# Patient Record
Sex: Male | Born: 1995
Health system: Southern US, Community
[De-identification: ages and names within clinical notes are randomized; demographics above are authoritative.]

## PROBLEM LIST (undated history)

## (undated) DIAGNOSIS — F419 Anxiety disorder, unspecified: Secondary | ICD-10-CM

## (undated) HISTORY — DX: Anxiety disorder, unspecified: F41.9

---
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
## 2015-05-04 ENCOUNTER — Emergency Department: Admission: EM | Admit: 2015-05-04 | Discharge: 2015-05-04 | Discharge status: Absent without leave | Payer: Self-pay

## 2016-03-07 ENCOUNTER — Emergency Department
Admission: EM | Admit: 2016-03-07 | Discharge: 2016-03-07 | Disposition: A | Discharge status: Home / Self Care | Payer: 59 | Attending: Emergency Medicine | Admitting: Emergency Medicine

## 2016-03-07 ENCOUNTER — Encounter: Payer: Self-pay | Admitting: Emergency Medicine

## 2016-03-07 DIAGNOSIS — M545 Low back pain: Secondary | ICD-10-CM | POA: Diagnosis present

## 2016-03-07 DIAGNOSIS — Y9289 Other specified places as the place of occurrence of the external cause: Secondary | ICD-10-CM | POA: Insufficient documentation

## 2016-03-07 DIAGNOSIS — Y990 Civilian activity done for income or pay: Secondary | ICD-10-CM | POA: Diagnosis not present

## 2016-03-07 DIAGNOSIS — S39012A Strain of muscle, fascia and tendon of lower back, initial encounter: Secondary | ICD-10-CM | POA: Insufficient documentation

## 2016-03-07 DIAGNOSIS — Y9389 Activity, other specified: Secondary | ICD-10-CM | POA: Insufficient documentation

## 2016-03-07 DIAGNOSIS — X500XXA Overexertion from strenuous movement or load, initial encounter: Secondary | ICD-10-CM | POA: Insufficient documentation

## 2016-03-07 MED ORDER — CYCLOBENZAPRINE HCL 5 MG PO TABS: 5.0000 mg | ORAL_TABLET | Freq: Three times a day (TID) | ORAL | Status: DC | PRN

## 2016-03-07 MED ORDER — TRAMADOL HCL 50 MG PO TABS: 50.0000 mg | ORAL_TABLET | Freq: Four times a day (QID) | ORAL | Status: DC | PRN

## 2016-03-07 NOTE — ED Notes (Signed)
Pt presents ambulatory to flex tx room with low back pain started yesterday, worse today. States he probably lifted heavy boxes the incorrect way. Denies any other symptoms.

## 2016-03-07 NOTE — ED Provider Notes (Signed)
[facility] Emergency Department Provider Note  ____________________________________________  Time seen: Approximately 7:34 AM  I have reviewed the triage vital signs and the nursing notes.   HISTORY  Chief Complaint Back Pain    HPI Ian Phillips is a 20 y.o. male patient complained of low back pain secondary to repetitive heavy lifting yesterday at work. Patient denies any radicular component to this pain. Patient denies any bladder or bowel dysfunction. Patient is taking ibuprofen last night with no relief. Patient rates his pain as 8/10. No other palliative measures taken for this complaint.   History reviewed. No pertinent past medical history.  There are no active problems to display for this patient.   History reviewed. No pertinent past surgical history.  Current Outpatient Rx  Name  Route  Sig  Dispense  Refill  . cyclobenzaprine (FLEXERIL) 5 MG tablet   Oral   Take 1 tablet (5 mg total) by mouth every 8 (eight) hours as needed for muscle spasms.   15 tablet   1   . traMADol (ULTRAM) 50 MG tablet   Oral   Take 1 tablet (50 mg total) by mouth every 6 (six) hours as needed for moderate pain.   12 tablet   0     Allergies Review of patient's allergies indicates no known allergies.  No family history on file.  Social History Social History  Substance Use Topics  . Smoking status: Never Smoker   . Smokeless tobacco: None  . Alcohol Use: No    Review of Systems Constitutional: No fever/chills Eyes: No visual changes. ENT: No sore throat. Cardiovascular: Denies chest pain. Respiratory: Denies shortness of breath. Gastrointestinal: No abdominal pain.  No nausea, no vomiting.  No diarrhea.  No constipation. Genitourinary: Negative for dysuria. Musculoskeletal: Positive for back pain. Skin: Negative for rash. Neurological: Negative for headaches, focal weakness or  numbness.    ____________________________________________   PHYSICAL EXAM:  VITAL SIGNS: ED Triage Vitals  Enc Vitals Group     BP 03/07/16 0658 142/77 mmHg     Pulse Rate 03/07/16 0658 67     Resp 03/07/16 0658 18     Temp 03/07/16 0658 97.6 F (36.4 C)     Temp Source 03/07/16 0658 Oral     SpO2 03/07/16 0658 100 %     Weight 03/07/16 0658 194 lb (87.998 kg)     Height 03/07/16 0658  (1.803 m)     Head Cir --      Peak Flow --      Pain Score 03/07/16 0656 8     Pain Loc --      Pain Edu? --      Excl. in GC? --     Constitutional: Alert and oriented. Well appearing and in no acute distress. Eyes: Conjunctivae are normal. PERRL. EOMI. Head: Atraumatic. Nose: No congestion/rhinnorhea. Mouth/Throat: Mucous membranes are moist.  Oropharynx non-erythematous. Neck: No stridor.  No cervical spine tenderness to palpation. Hematological/Lymphatic/Immunilogical: No cervical lymphadenopathy. Cardiovascular: Normal rate, regular rhythm. Grossly normal heart sounds.  Good peripheral circulation. Respiratory: Normal respiratory effort.  No retractions. Lungs CTAB. Gastrointestinal: Soft and nontender. No distention. No abdominal bruits. No CVA tenderness. Musculoskeletal: No spinal deformity. No guarding palpation of the lumbar spinal processes. Patient has right paraspinal muscle spasm with left lateral movements. Patient has negative straight leg test. Neurologic:  Normal speech and language. No gross focal neurologic deficits are appreciated. No gait instability. Skin:  Skin is warm, dry  and intact. No rash noted. Psychiatric: Mood and affect are normal. Speech and behavior are normal.  ____________________________________________   LABS (all labs ordered are listed, but only abnormal results are displayed)  Labs Reviewed - No data to  display ____________________________________________  EKG   ____________________________________________  RADIOLOGY   ____________________________________________   PROCEDURES  Procedure(s) performed: None  Critical Care performed: No  ____________________________________________   INITIAL IMPRESSION / ASSESSMENT AND PLAN / ED COURSE  Pertinent labs & imaging results that were available during my care of the patient were reviewed by me and considered in my medical decision making (see chart for details).  Right paraspinal muscles strain. Patient given discharge care instructions. Patient prescription for Flexeril and tramadol. Patient [facility]. Patient given a work note for today. ____________________________________________   FINAL CLINICAL IMPRESSION(S) / ED DIAGNOSES  Final diagnoses:  Lumbar strain, initial encounter      Joni ReiningRonald K Smith, PA-C 03/07/16 [id number]  Emily FilbertJonathan E Williams, MD 03/07/16 [phone]

## 2016-03-07 NOTE — ED Notes (Signed)
Patient ambulatory to triage with steady gait, without difficulty or distress noted; pt reports lower back pain since yesterday after lifting boxes at work; denies desire to file workers comp at this time

## 2016-03-14 ENCOUNTER — Emergency Department: Payer: 59

## 2016-03-14 ENCOUNTER — Encounter: Payer: Self-pay | Admitting: *Deleted

## 2016-03-14 ENCOUNTER — Emergency Department
Admission: EM | Admit: 2016-03-14 | Discharge: 2016-03-14 | Disposition: A | Discharge status: Home / Self Care | Payer: 59 | Attending: Emergency Medicine | Admitting: Emergency Medicine

## 2016-03-14 DIAGNOSIS — M545 Low back pain: Secondary | ICD-10-CM

## 2016-03-14 MED ORDER — METHOCARBAMOL 500 MG PO TABS: 500.0000 mg | ORAL_TABLET | Freq: Four times a day (QID) | ORAL | Status: DC | PRN

## 2016-03-14 MED ORDER — NAPROXEN 500 MG PO TABS: 500.0000 mg | ORAL_TABLET | Freq: Two times a day (BID) | ORAL | Status: DC

## 2016-03-14 NOTE — Discharge Instructions (Signed)

## 2016-03-14 NOTE — ED Notes (Signed)
States lower back pain, states he was seen last week for same complaint, states he felt worse pain at work this AM, ambulatory to triage

## 2016-03-14 NOTE — ED Provider Notes (Signed)
[facility] Emergency Department Provider Note  ____________________________________________  Time seen: Approximately 12:10 PM  I have reviewed the triage vital signs and the nursing notes.   HISTORY  Chief Complaint Back Pain    HPI Ian Phillips is a 20 y.o. male presents for evaluation continued low back pain. Patient states that he was here one week ago for the same thing. States no relief with Flexeril. Does a lot of repetitive working, lifting at work. Concern isgoing on with his lower back. Had an MRI in the past. Describes his pain presently as 7/10, nonradiating. Denies any numbness or tingling no relief with current prescriptions.   History reviewed. No pertinent past medical history.  There are no active problems to display for this patient.   History reviewed. No pertinent past surgical history.  Current Outpatient Rx  Name  Route  Sig  Dispense  Refill  . methocarbamol (ROBAXIN) 500 MG tablet   Oral   Take 1 tablet (500 mg total) by mouth every 6 (six) hours as needed for muscle spasms.   30 tablet   0   . naproxen (NAPROSYN) 500 MG tablet   Oral   Take 1 tablet (500 mg total) by mouth 2 (two) times daily with a meal.   60 tablet   0     Allergies Review of patient's allergies indicates no known allergies.  History reviewed. No pertinent family history.  Social History Social History  Substance Use Topics  . Smoking status: Never Smoker   . Smokeless tobacco: None  . Alcohol Use: No    Review of Systems Constitutional: No fever/chills Gastrointestinal: No abdominal pain.  No nausea, no vomiting.  No diarrhea.  No constipation. Genitourinary: Negative for dysuria. Musculoskeletal: Positive for low back pain. Skin: Negative for rash. Neurological: Negative for headaches, focal weakness or numbness.  10-point ROS otherwise negative.  ____________________________________________   PHYSICAL EXAM:  VITAL  SIGNS: ED Triage Vitals  Enc Vitals Group     BP 03/14/16 1145 144/82 mmHg     Pulse Rate 03/14/16 1145 76     Resp 03/14/16 1145 18     Temp 03/14/16 1145 98.3 F (36.8 C)     Temp Source 03/14/16 1145 Oral     SpO2 03/14/16 1145 99 %     Weight 03/14/16 1145 193 lb (87.544 kg)     Height 03/14/16 1145  (1.803 m)     Head Cir --      Peak Flow --      Pain Score 03/14/16 1146 7     Pain Loc --      Pain Edu? --      Excl. in GC? --     Constitutional: Alert and oriented. Well appearing and in no acute distress. Musculoskeletal: No spinal deformity. No guarding. Positive straight leg raise bilateral with some paraspinal muscle spasms on both sides. Neurologic:  Normal speech and language. No gross focal neurologic deficits are appreciated. No gait instability. Skin:  Skin is warm, dry and intact. No rash noted. Psychiatric: Mood and affect are normal. Speech and behavior are normal.  ____________________________________________   LABS (all labs ordered are listed, but only abnormal results are displayed)  Labs Reviewed - No data to display ____________________________________________  RADIOLOGY  No acute radiological findings. ____________________________________________   PROCEDURES  Procedure(s) performed: None  Critical Care performed: No  ____________________________________________   INITIAL IMPRESSION / ASSESSMENT AND PLAN / ED COURSE  Pertinent labs &  imaging results that were available during my care of the patient were reviewed by me and considered in my medical decision making (see chart for details).  Recurrent low back pain. Rx given for Robaxin and Naprosyn for pain. Patient follow-up with PCP or return to the ER with any worsening symptomology. ____________________________________________   FINAL CLINICAL IMPRESSION(S) / ED DIAGNOSES  Final diagnoses:  Low back pain without sciatica, unspecified back pain laterality     This chart  was dictated using voice recognition software/Dragon. Despite best efforts to proofread, errors can occur which can change the meaning. Any change was purely unintentional.   Evangeline Dakinharles M Brithney Bensen, PA-C 03/14/16 1347  Emily FilbertJonathan E Williams, MD 03/14/16 1435

## 2016-12-26 DIAGNOSIS — K529 Noninfective gastroenteritis and colitis, unspecified: Secondary | ICD-10-CM | POA: Diagnosis not present

## 2017-04-28 ENCOUNTER — Encounter: Payer: Self-pay | Admitting: Emergency Medicine

## 2017-04-28 ENCOUNTER — Emergency Department: Payer: BLUE CROSS/BLUE SHIELD

## 2017-04-28 ENCOUNTER — Emergency Department
Admission: EM | Admit: 2017-04-28 | Discharge: 2017-04-28 | Disposition: A | Discharge status: Home / Self Care | Payer: BLUE CROSS/BLUE SHIELD | Attending: Emergency Medicine | Admitting: Emergency Medicine

## 2017-04-28 DIAGNOSIS — Y9372 Activity, wrestling: Secondary | ICD-10-CM | POA: Insufficient documentation

## 2017-04-28 DIAGNOSIS — W500XXA Accidental hit or strike by another person, initial encounter: Secondary | ICD-10-CM | POA: Diagnosis not present

## 2017-04-28 DIAGNOSIS — S9031XA Contusion of right foot, initial encounter: Secondary | ICD-10-CM | POA: Insufficient documentation

## 2017-04-28 DIAGNOSIS — Y929 Unspecified place or not applicable: Secondary | ICD-10-CM | POA: Insufficient documentation

## 2017-04-28 DIAGNOSIS — M79671 Pain in right foot: Secondary | ICD-10-CM | POA: Diagnosis not present

## 2017-04-28 DIAGNOSIS — Y999 Unspecified external cause status: Secondary | ICD-10-CM | POA: Insufficient documentation

## 2017-04-28 DIAGNOSIS — S99921A Unspecified injury of right foot, initial encounter: Secondary | ICD-10-CM | POA: Diagnosis not present

## 2017-04-28 MED ORDER — MELOXICAM 7.5 MG PO TABS: 7.5000 mg | ORAL_TABLET | Freq: Every day | ORAL | 1 refills | Status: AC

## 2017-04-28 NOTE — ED Provider Notes (Signed)
[facility] Emergency Department Provider Note  ____________________________________________  Time seen: Approximately 8:14 PM  I have reviewed the triage vital signs and the nursing notes.   HISTORY  Chief Complaint Foot Pain    HPI Ian Phillips is a 21 y.o. male presenting to the emergency department with acute 7/10 right foot pain for one day. Patient states that he was in a wrestling practice last night and was accidentally kicked in the right foot. Patient states that he fell but he did not hit his head. Patient states that he has been able to ambulate since the incident. He denies radiculopathy, weakness, pain out of proportion and coldness of the right lower extremity. Patient denies prior traumas or surgeries to the right lower extremity. No alleviating measures have been attempted.   History reviewed. No pertinent past medical history.  There are no active problems to display for this patient.   History reviewed. No pertinent surgical history.  Prior to Admission medications   Medication Sig Start Date End Date Taking? Authorizing Provider  ibuprofen (ADVIL,MOTRIN) 600 MG tablet Take 600 mg by mouth every 6 (six) hours as needed for mild pain or moderate pain.   Yes [provider]  meloxicam (MOBIC) 7.5 MG tablet Take 1 tablet (7.5 mg total) by mouth daily. 04/28/17 05/05/17  Orvil Feil, PA-C    Allergies Patient has no known allergies.  History reviewed. No pertinent family history.  Social History Social History  Substance Use Topics  . Smoking status: Never Smoker  . Smokeless tobacco: Never Used  . Alcohol use No     Review of Systems  Constitutional: No fever/chills Eyes: No visual changes. No discharge ENT: No upper respiratory complaints. Cardiovascular: no chest pain. Respiratory: no cough. No SOB. Gastrointestinal: No abdominal pain.  No nausea, no vomiting.  No diarrhea. No  constipation. Musculoskeletal: Patient has right foot pain.  Skin: Negative for rash, abrasions, lacerations, ecchymosis. Neurological: Negative for headaches, focal weakness or numbness.   ____________________________________________   PHYSICAL EXAM:  VITAL SIGNS: ED Triage Vitals  Enc Vitals Group     BP 04/28/17 1914 (!) 166/78     Pulse Rate 04/28/17 1914 79     Resp 04/28/17 1914 14     Temp 04/28/17 1914 98.1 F (36.7 C)     Temp Source 04/28/17 1914 Oral     SpO2 04/28/17 1914 100 %     Weight 04/28/17 1914 195 lb (88.5 kg)     Height 04/28/17 1914 5\' 11"  (1.803 m)     Head Circumference --      Peak Flow --      Pain Score 04/28/17 1913 7     Pain Loc --      Pain Edu? --      Excl. in GC? --      Constitutional: Alert and oriented. Well appearing and in no acute distress. Eyes: Conjunctivae are normal. PERRL. EOMI. Head: Atraumatic. Cardiovascular: Normal rate, regular rhythm. Normal S1 and S2.  Good peripheral circulation. Respiratory: Normal respiratory effort without tachypnea or retractions. Lungs CTAB. Good air entry to the bases with no decreased or absent breath sounds. Musculoskeletal:Patient has 5/5 strength in the lower extremities bilaterally.Full range of motion at the hip, knee and ankle bilaterally. Patient is able to move all 5 right toes. Pain is elicited with light palpation along the first through fourth right metatarsals. Palpable dorsalis pedis pulse bilaterally and symmetrically. No changes in gait.   Neurologic:  Normal speech and language. No gross focal neurologic deficits are appreciated.  Skin:  Skin is warm, dry and intact. No rash noted. Psychiatric: Mood and affect are normal. Speech and behavior are normal. Patient exhibits appropriate insight and judgement.   ____________________________________________   LABS (all labs ordered are listed, but only abnormal results are displayed)  Labs Reviewed - No data to  display ____________________________________________  EKG   ____________________________________________  RADIOLOGY Geraldo PitterI, Lakrista Scaduto M Finnian Husted, personally viewed and evaluated these images (plain radiographs) as part of my medical decision making, as well as reviewing the written report by the radiologist.  Dg Foot Complete Right  Result Date: 04/28/2017 CLINICAL DATA:  Right foot pain/injury EXAM: RIGHT FOOT COMPLETE - 3+ VIEW COMPARISON:  None. FINDINGS: No fracture or dislocation is seen. The joint spaces are preserved. Visualized soft tissues are within normal limits. IMPRESSION: Negative. Electronically Signed   By: Charline BillsSriyesh  Krishnan M.D.   On: 04/28/2017 19:34    ____________________________________________    PROCEDURES  Procedure(s) performed:    Procedures    Medications - No data to display   ____________________________________________   INITIAL IMPRESSION / ASSESSMENT AND PLAN / ED COURSE  Pertinent labs & imaging results that were available during my care of the patient were reviewed by me and considered in my medical decision making (see chart for details).  Review of the [place] CSRS was performed in accordance of the [facility] prior to dispensing any controlled drugs.     Assessment and plan: Right Foot Pain:  Patient presents to the emergency department with right foot pain for one day. Patient states that he was accidentally kicked during a wrestling practice. DG right foot reveals no acute fractures or bony abnormalities. Physical exam is reassuring. Patient was discharged with Mobic to be used as needed for pain and inflammation. A referral was given to podiatry, Dr. Orland Jarredroxler. Vital signs were reassuring prior to discharge. All patient questions were answered.    ____________________________________________  FINAL CLINICAL IMPRESSION(S) / ED DIAGNOSES  Final diagnoses:  Contusion of right foot, initial encounter      NEW MEDICATIONS STARTED DURING THIS  VISIT:  Discharge Medication List as of 04/28/2017  8:00 PM    START taking these medications   Details  meloxicam (MOBIC) 7.5 MG tablet Take 1 tablet (7.5 mg total) by mouth daily., Starting Sun 04/28/2017, Until Sun 05/05/2017, Print            This chart was dictated using voice recognition software/Dragon. Despite best efforts to proofread, errors can occur which can change the meaning. Any change was purely unintentional.    Gasper LloydWoods, Jery Hollern M, PA-C 04/28/17 2033    Myrna BlazerSchaevitz, David Matthew, MD 04/28/17 2350
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
## 2017-04-28 NOTE — ED Notes (Signed)
Pt with pain to top of right foot. Cms intact to right toes. Pt complains of increased pain to top of foot with flexion of toes.

## 2017-07-15 ENCOUNTER — Emergency Department
Admission: EM | Admit: 2017-07-15 | Discharge: 2017-07-15 | Disposition: A | Discharge status: Home / Self Care | Payer: BLUE CROSS/BLUE SHIELD | Attending: Student in an Organized Health Care Education/Training Program | Admitting: Student in an Organized Health Care Education/Training Program

## 2017-07-15 ENCOUNTER — Emergency Department: Payer: BLUE CROSS/BLUE SHIELD

## 2017-07-15 DIAGNOSIS — M25562 Pain in left knee: Secondary | ICD-10-CM | POA: Diagnosis not present

## 2017-07-15 DIAGNOSIS — Y998 Other external cause status: Secondary | ICD-10-CM | POA: Diagnosis not present

## 2017-07-15 DIAGNOSIS — Y929 Unspecified place or not applicable: Secondary | ICD-10-CM | POA: Diagnosis not present

## 2017-07-15 DIAGNOSIS — Y9372 Activity, wrestling: Secondary | ICD-10-CM | POA: Diagnosis not present

## 2017-07-15 DIAGNOSIS — X501XXA Overexertion from prolonged static or awkward postures, initial encounter: Secondary | ICD-10-CM | POA: Diagnosis not present

## 2017-07-15 MED ORDER — NAPROXEN 500 MG PO TABS
500.0000 mg | ORAL_TABLET | Freq: Once | ORAL | Status: AC
  Administered 2017-07-15: 500 mg via ORAL
  Filled 2017-07-15: qty 1

## 2017-07-15 MED ORDER — TRAMADOL HCL 50 MG PO TABS
50.0000 mg | ORAL_TABLET | Freq: Once | ORAL | Status: AC
Start: 2017-07-15 — End: 2017-07-15
  Administered 2017-07-15: 50 mg via ORAL
  Filled 2017-07-15: qty 1

## 2017-07-15 MED ORDER — NAPROXEN 500 MG PO TABS: 500.0000 mg | ORAL_TABLET | Freq: Two times a day (BID) | ORAL | Status: DC

## 2017-07-15 MED ORDER — TRAMADOL HCL 50 MG PO TABS: 50.0000 mg | ORAL_TABLET | Freq: Four times a day (QID) | ORAL | 0 refills | Status: DC | PRN

## 2017-07-15 NOTE — ED Notes (Signed)
See triage note  States he was wrestling this weekend and developed pain to left knee  States he is having increased pain with bending and straightening  No swelling or deformity noted

## 2017-07-15 NOTE — Discharge Instructions (Signed)
Knee nebulizer for 3-5 days as needed. Advise clearance from orthopedics or sports medicine doctor before resuming competitive activities.

## 2017-07-15 NOTE — ED Triage Notes (Signed)
Pt states he injured his left knee on Saturday.the patient ambulatory to triage without difficulty.

## 2017-07-15 NOTE — ED Provider Notes (Signed)
[facility] Emergency Department Provider Note   ____________________________________________   First MD Initiated Contact with Patient 07/15/17 1443     (approximate)  I have reviewed the triage vital signs and the nursing notes.   HISTORY  Chief Complaint Knee Pain    HPI Ian Phillips is a 21 y.o. male patient complaining the left knee pain secondary to a twisting incident 2 days ago. Patient was in a wrestling match in which there was a hyperextension twisting incident to the left knee. Patient stated pain increases with extension and weightbearing. Patient points to the medial aspect of his knee as a source of pain. No palliative measures for complaint. Patient rates the pain as a 5/10. Patient described a pain as a "dull ache".   History reviewed. No pertinent past medical history.  There are no active problems to display for this patient.   History reviewed. No pertinent surgical history.  Prior to Admission medications   Medication Sig Start Date End Date Taking? Authorizing Provider  ibuprofen (ADVIL,MOTRIN) 600 MG tablet Take 600 mg by mouth every 6 (six) hours as needed for mild pain or moderate pain.    [provider]  naproxen (NAPROSYN) 500 MG tablet Take 1 tablet (500 mg total) by mouth 2 (two) times daily with a meal. 07/15/17   Joni ReiningSmith, Ronald K, PA-C  traMADol (ULTRAM) 50 MG tablet Take 1 tablet (50 mg total) by mouth every 6 (six) hours as needed for moderate pain. 07/15/17   Joni ReiningSmith, Ronald K, PA-C    Allergies Patient has no known allergies.  No family history on file.  Social History Social History  Substance Use Topics  . Smoking status: Never Smoker  . Smokeless tobacco: Never Used  . Alcohol use No    Review of Systems  Constitutional: No fever/chills Eyes: No visual changes. ENT: No sore throat. Cardiovascular: Denies chest pain. Respiratory: Denies shortness of breath. Gastrointestinal: No  abdominal pain.  No nausea, no vomiting.  No diarrhea.  No constipation. Genitourinary: Negative for dysuria. Musculoskeletal: Left knee pain  Skin: Negative for rash. Neurological: Negative for headaches, focal weakness or numbness.   ____________________________________________   PHYSICAL EXAM:  VITAL SIGNS: ED Triage Vitals [07/15/17 1325]  Enc Vitals Group     BP      Pulse      Resp      Temp      Temp src      SpO2      Weight      Height      Head Circumference      Peak Flow      Pain Score 5     Pain Loc      Pain Edu?      Excl. in GC?     Constitutional: Alert and oriented. Well appearing and in no acute distress. Cardiovascular: Normal rate, regular rhythm. Grossly normal heart sounds.  Good peripheral circulation. Respiratory: Normal respiratory effort.  No retractions. Lungs CTAB. Musculoskeletal:No obvious deformity or edema to the left knee. Patient decreased range of motion with extension against resistance. Patient has moderate guarding palpation social point of the MCL.  Neurologic:  Normal speech and language. No gross focal neurologic deficits are appreciated. No gait instability. Skin:  Skin is warm, dry and intact. No rash noted. Psychiatric: Mood and affect are normal. Speech and behavior are normal.  ____________________________________________   LABS (all labs ordered are listed, but only abnormal results are displayed)  Labs Reviewed - No data to display ____________________________________________  EKG   ____________________________________________  RADIOLOGY  Dg Knee Complete 4 Views Left  Result Date: 07/15/2017 CLINICAL DATA:  Pulling sensation in the left knee after a wrestling match 2 days ago. The patient reports persistent superior, medial, and inferolateral knee pain since. The patient has difficulty walking and fully flexing and extending the knee. EXAM: LEFT KNEE - COMPLETE 4+ VIEW COMPARISON:  None in PACs FINDINGS: The  bones are subjectively adequately mineralized. The joint spaces are well maintained. There is no joint effusion. There is no acute fracture or dislocation. IMPRESSION: There is no acute or significant chronic bony abnormality of the left knee. If the patient's symptoms persist, MRI would be the most useful next imaging step. Electronically Signed   By: David  SwazilandJordan M.D.   On: 07/15/2017 15:18    ___No acute findings x-ray of the left knee. _________________________________________   PROCEDURES  Procedure(s) performed: None  Procedures  Critical Care performed: No  ____________________________________________   INITIAL IMPRESSION / ASSESSMENT AND PLAN / ED COURSE  Pertinent labs & imaging results that were available during my care of the patient were reviewed by me and considered in my medical decision making (see chart for details).  Patient's percent with left knee pain secondary to a hyperextension and twisting incident. Discussed negative x-ray finding with patient. Patient placed in a knee immobilizer and given anti-inflammatory pain medication for 3 days. Patient advised to be cleared by either orthopedics or sports medicine doctor for returning back to competitive sports. Patient given a work note.      ____________________________________________   FINAL CLINICAL IMPRESSION(S) / ED DIAGNOSES  Final diagnoses:  Acute pain of left knee      NEW MEDICATIONS STARTED DURING THIS VISIT:  New Prescriptions   NAPROXEN (NAPROSYN) 500 MG TABLET    Take 1 tablet (500 mg total) by mouth 2 (two) times daily with a meal.   TRAMADOL (ULTRAM) 50 MG TABLET    Take 1 tablet (50 mg total) by mouth every 6 (six) hours as needed for moderate pain.     Note:  This document was prepared using Dragon voice recognition software and may include unintentional dictation errors.    Joni ReiningSmith, Ronald K, PA-C 07/15/17 1545    Willy Eddyobinson, Patrick, MD 07/15/17 [phone]

## 2017-08-23 DIAGNOSIS — S8392XA Sprain of unspecified site of left knee, initial encounter: Secondary | ICD-10-CM | POA: Diagnosis not present

## 2017-08-23 DIAGNOSIS — E669 Obesity, unspecified: Secondary | ICD-10-CM | POA: Diagnosis not present

## 2017-11-03 DIAGNOSIS — M25561 Pain in right knee: Secondary | ICD-10-CM | POA: Diagnosis not present

## 2017-11-03 DIAGNOSIS — S8991XA Unspecified injury of right lower leg, initial encounter: Secondary | ICD-10-CM | POA: Diagnosis not present

## 2017-11-12 DIAGNOSIS — S8391XA Sprain of unspecified site of right knee, initial encounter: Secondary | ICD-10-CM | POA: Diagnosis not present

## 2017-11-12 DIAGNOSIS — M2351 Chronic instability of knee, right knee: Secondary | ICD-10-CM | POA: Diagnosis not present

## 2017-11-12 DIAGNOSIS — M2391 Unspecified internal derangement of right knee: Secondary | ICD-10-CM | POA: Diagnosis not present

## 2017-11-13 ENCOUNTER — Other Ambulatory Visit: Payer: Self-pay | Admitting: Orthopedic Surgery

## 2017-11-13 DIAGNOSIS — S83521A Sprain of posterior cruciate ligament of right knee, initial encounter: Secondary | ICD-10-CM

## 2017-11-20 ENCOUNTER — Ambulatory Visit
Admission: RE | Admit: 2017-11-20 | Discharge: 2017-11-20 | Disposition: A | Discharge status: Home / Self Care | Payer: BLUE CROSS/BLUE SHIELD | Source: Ambulatory Visit | Attending: Orthopedic Surgery | Admitting: Orthopedic Surgery

## 2017-11-20 DIAGNOSIS — S8391XA Sprain of unspecified site of right knee, initial encounter: Secondary | ICD-10-CM | POA: Insufficient documentation

## 2017-11-20 DIAGNOSIS — S83521A Sprain of posterior cruciate ligament of right knee, initial encounter: Secondary | ICD-10-CM

## 2017-11-20 DIAGNOSIS — X58XXXA Exposure to other specified factors, initial encounter: Secondary | ICD-10-CM | POA: Diagnosis not present

## 2017-11-20 DIAGNOSIS — M25461 Effusion, right knee: Secondary | ICD-10-CM | POA: Insufficient documentation

## 2017-11-20 DIAGNOSIS — R600 Localized edema: Secondary | ICD-10-CM | POA: Diagnosis not present

## 2018-06-24 ENCOUNTER — Other Ambulatory Visit: Payer: Self-pay

## 2018-06-24 ENCOUNTER — Emergency Department: Payer: BLUE CROSS/BLUE SHIELD

## 2018-06-24 ENCOUNTER — Emergency Department
Admission: EM | Admit: 2018-06-24 | Discharge: 2018-06-24 | Disposition: A | Discharge status: Home / Self Care | Payer: BLUE CROSS/BLUE SHIELD | Attending: Emergency Medicine | Admitting: Emergency Medicine

## 2018-06-24 ENCOUNTER — Encounter: Payer: Self-pay | Admitting: Emergency Medicine

## 2018-06-24 DIAGNOSIS — M542 Cervicalgia: Secondary | ICD-10-CM | POA: Diagnosis not present

## 2018-06-24 DIAGNOSIS — M25561 Pain in right knee: Secondary | ICD-10-CM

## 2018-06-24 DIAGNOSIS — Y998 Other external cause status: Secondary | ICD-10-CM | POA: Diagnosis not present

## 2018-06-24 DIAGNOSIS — S299XXA Unspecified injury of thorax, initial encounter: Secondary | ICD-10-CM | POA: Diagnosis not present

## 2018-06-24 DIAGNOSIS — S50811A Abrasion of right forearm, initial encounter: Secondary | ICD-10-CM | POA: Diagnosis not present

## 2018-06-24 DIAGNOSIS — S5011XA Contusion of right forearm, initial encounter: Secondary | ICD-10-CM | POA: Insufficient documentation

## 2018-06-24 DIAGNOSIS — R0789 Other chest pain: Secondary | ICD-10-CM | POA: Insufficient documentation

## 2018-06-24 DIAGNOSIS — Y92410 Unspecified street and highway as the place of occurrence of the external cause: Secondary | ICD-10-CM | POA: Insufficient documentation

## 2018-06-24 DIAGNOSIS — Y9389 Activity, other specified: Secondary | ICD-10-CM | POA: Insufficient documentation

## 2018-06-24 DIAGNOSIS — R079 Chest pain, unspecified: Secondary | ICD-10-CM | POA: Diagnosis not present

## 2018-06-24 DIAGNOSIS — S59911A Unspecified injury of right forearm, initial encounter: Secondary | ICD-10-CM | POA: Diagnosis not present

## 2018-06-24 LAB — TROPONIN I

## 2018-06-24 MED ORDER — NAPROXEN 500 MG PO TABS: 500.0000 mg | ORAL_TABLET | Freq: Two times a day (BID) | ORAL | 2 refills | Status: DC

## 2018-06-24 NOTE — ED Notes (Signed)
Patient reports pain to left arm and knee. Abrasion noted to left forearm. No active bleeding. Able to bend all extremities freely.

## 2018-06-24 NOTE — ED Notes (Signed)
First Nurse Note: Reviewed patient with Dr. Darnelle CatalanMalinda.
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
## 2018-06-24 NOTE — ED Notes (Signed)
Abrasion to RT forearm cleaned and dressed at this time

## 2018-06-24 NOTE — ED Notes (Addendum)
Purple tube sent to lab as well.
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
## 2018-07-23 DIAGNOSIS — S5011XA Contusion of right forearm, initial encounter: Secondary | ICD-10-CM | POA: Diagnosis not present

## 2018-07-23 DIAGNOSIS — S8011XA Contusion of right lower leg, initial encounter: Secondary | ICD-10-CM | POA: Diagnosis not present

## 2018-07-23 DIAGNOSIS — S8001XA Contusion of right knee, initial encounter: Secondary | ICD-10-CM | POA: Diagnosis not present

## 2018-07-23 DIAGNOSIS — E669 Obesity, unspecified: Secondary | ICD-10-CM | POA: Diagnosis not present

## 2018-08-04 IMAGING — DX DG FOOT COMPLETE 3+V*R*
3 series · 3 of 3 positions shown · non-contrast
Comparison: None.

CLINICAL DATA: Right foot pain/injury

EXAM:
RIGHT FOOT COMPLETE - 3+ VIEW

[foot ap]
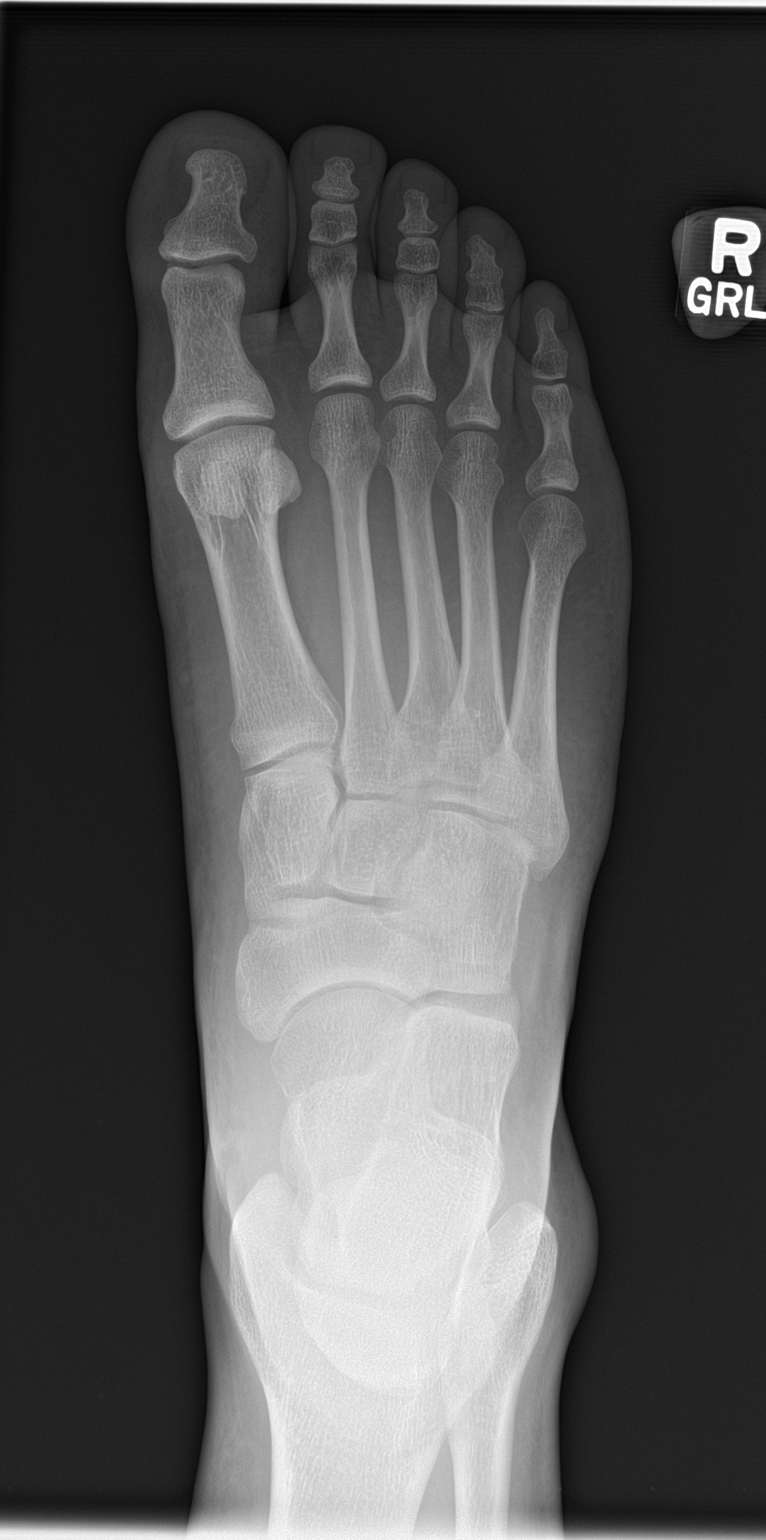

[foot obl]
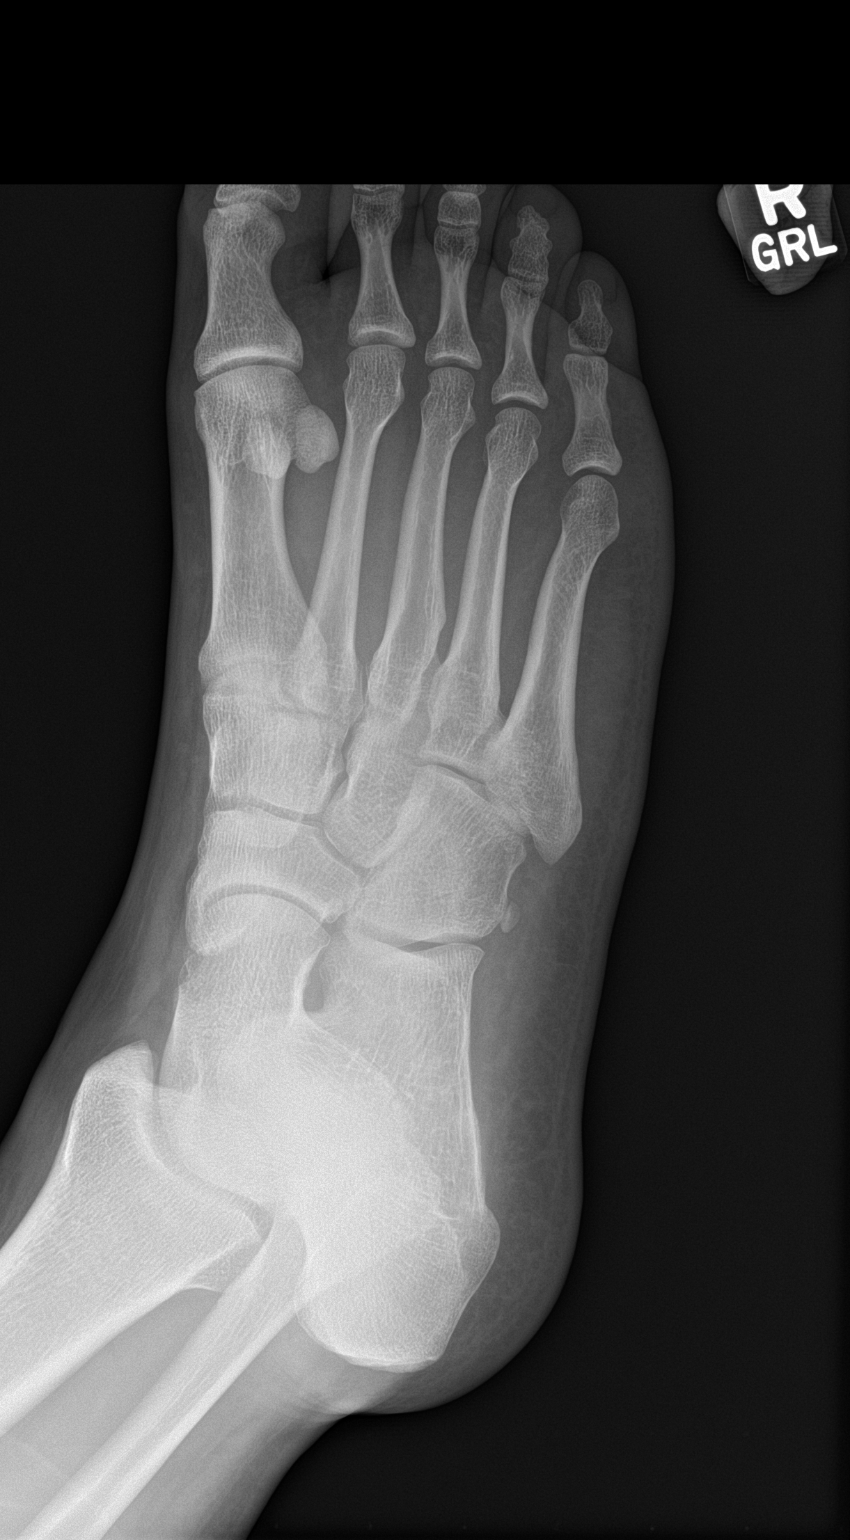

[foot lat]
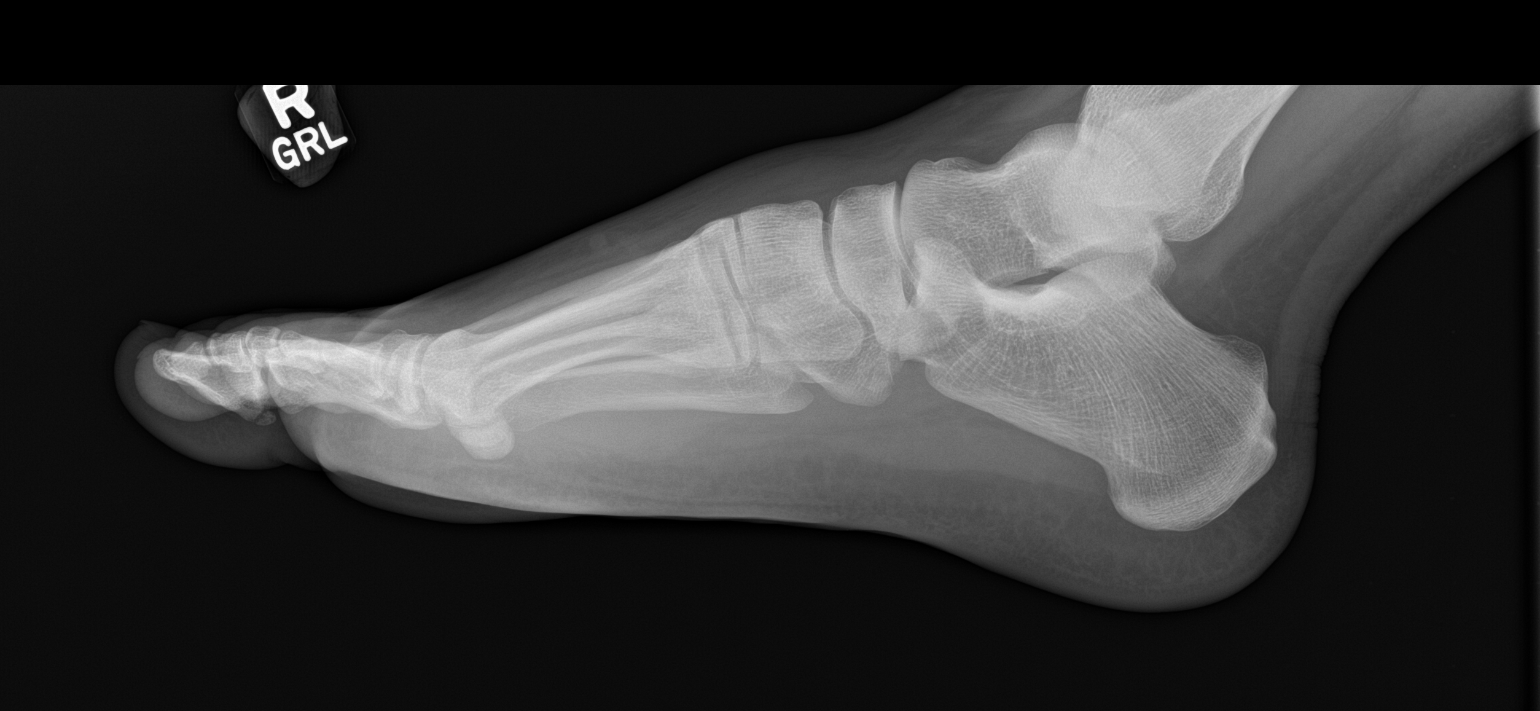

[3 of 3 positions shown; findings below may reference images not displayed]

FINDINGS: No fracture or dislocation is seen.

The joint spaces are preserved.

Visualized soft tissues are within normal limits.
IMPRESSION: Negative.

## 2018-10-01 ENCOUNTER — Other Ambulatory Visit (HOSPITAL_COMMUNITY)
Admission: RE | Admit: 2018-10-01 | Discharge: 2018-10-01 | Disposition: A | Discharge status: Home / Self Care | Payer: BLUE CROSS/BLUE SHIELD | Source: Ambulatory Visit | Attending: Physician Assistant | Admitting: Physician Assistant

## 2018-10-01 ENCOUNTER — Encounter: Payer: Self-pay | Admitting: Physician Assistant

## 2018-10-01 ENCOUNTER — Ambulatory Visit (INDEPENDENT_AMBULATORY_CARE_PROVIDER_SITE_OTHER): Payer: BLUE CROSS/BLUE SHIELD | Admitting: Physician Assistant

## 2018-10-01 VITALS — BP 128/90 | HR 77 | Temp 98.6°F | Ht 71.0 in | Wt 215.0 lb

## 2018-10-01 DIAGNOSIS — Z23 Encounter for immunization: Secondary | ICD-10-CM | POA: Diagnosis not present

## 2018-10-01 DIAGNOSIS — Z130 Encounter for screening for diseases of the blood and blood-forming organs and certain disorders involving the immune mechanism: Secondary | ICD-10-CM

## 2018-10-01 DIAGNOSIS — F419 Anxiety disorder, unspecified: Secondary | ICD-10-CM

## 2018-10-01 DIAGNOSIS — Z114 Encounter for screening for human immunodeficiency virus [HIV]: Secondary | ICD-10-CM

## 2018-10-01 DIAGNOSIS — Z131 Encounter for screening for diabetes mellitus: Secondary | ICD-10-CM

## 2018-10-01 DIAGNOSIS — Z118 Encounter for screening for other infectious and parasitic diseases: Secondary | ICD-10-CM | POA: Insufficient documentation

## 2018-10-01 DIAGNOSIS — Z1329 Encounter for screening for other suspected endocrine disorder: Secondary | ICD-10-CM

## 2018-10-01 DIAGNOSIS — Z13220 Encounter for screening for lipoid disorders: Secondary | ICD-10-CM

## 2018-10-01 MED ORDER — CITALOPRAM HYDROBROMIDE 20 MG PO TABS: 20.0000 mg | ORAL_TABLET | Freq: Every day | ORAL | 0 refills | Status: DC
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
## 2018-10-02 ENCOUNTER — Telehealth: Payer: Self-pay

## 2018-10-02 LAB — COMPREHENSIVE METABOLIC PANEL
ALT: 21 IU/L (ref 0–44)
AST: 18 IU/L (ref 0–40)
Albumin/Globulin Ratio: 2.6 — ABNORMAL HIGH (ref 1.2–2.2)
Albumin: 5 g/dL (ref 3.5–5.5)
Alkaline Phosphatase: 80 IU/L (ref 39–117)
BUN/Creatinine Ratio: 11 (ref 9–20)
BUN: 11 mg/dL (ref 6–20)
Bilirubin Total: 0.5 mg/dL (ref 0.0–1.2)
CO2: 22 mmol/L (ref 20–29)
Calcium: 10 mg/dL (ref 8.7–10.2)
Chloride: 102 mmol/L (ref 96–106)
Creatinine, Ser: 1.02 mg/dL (ref 0.76–1.27)
GFR calc Af Amer: 119 mL/min/{1.73_m2} (ref >59)
GFR calc non Af Amer: 103 mL/min/{1.73_m2} (ref >59)
Globulin, Total: 1.9 g/dL (ref 1.5–4.5)
Glucose: 87 mg/dL (ref 65–99)
Potassium: 4.4 mmol/L (ref 3.5–5.2)
Sodium: 141 mmol/L (ref 134–144)
Total Protein: 6.9 g/dL (ref 6.0–8.5)

## 2018-10-02 LAB — CBC WITH DIFFERENTIAL/PLATELET
Basophils Absolute: 0 10*3/uL (ref 0.0–0.2)
Basos: 0 %
EOS (ABSOLUTE): 0.2 10*3/uL (ref 0.0–0.4)
Eos: 3 %
Hematocrit: 40.5 % (ref 37.5–51.0)
Hemoglobin: 13.9 g/dL (ref 13.0–17.7)
Immature Grans (Abs): 0 10*3/uL (ref 0.0–0.1)
Immature Granulocytes: 0 %
Lymphocytes Absolute: 1.5 10*3/uL (ref 0.7–3.1)
Lymphs: 32 %
MCH: 28.7 pg (ref 26.6–33.0)
MCHC: 34.3 g/dL (ref 31.5–35.7)
MCV: 84 fL (ref 79–97)
Monocytes Absolute: 0.4 10*3/uL (ref 0.1–0.9)
Monocytes: 9 %
Neutrophils Absolute: 2.7 10*3/uL (ref 1.4–7.0)
Neutrophils: 56 %
Platelets: 241 10*3/uL (ref 150–450)
RBC: 4.85 x10E6/uL (ref 4.14–5.80)
RDW: 12.6 % (ref 12.3–15.4)
WBC: 4.8 10*3/uL (ref 3.4–10.8)

## 2018-10-02 LAB — LIPID PANEL
Chol/HDL Ratio: 4.2 ratio (ref 0.0–5.0)
Cholesterol, Total: 189 mg/dL (ref 100–199)
HDL: 45 mg/dL (ref >39)
LDL Calculated: 127 mg/dL — ABNORMAL HIGH (ref 0–99)
Triglycerides: 84 mg/dL (ref 0–149)
VLDL Cholesterol Cal: 17 mg/dL (ref 5–40)

## 2018-10-02 LAB — URINE CYTOLOGY ANCILLARY ONLY: Chlamydia: NEGATIVE

## 2018-10-02 LAB — TSH: TSH: 1.7 u[IU]/mL (ref 0.450–4.500)

## 2018-10-02 LAB — HIV ANTIBODY (ROUTINE TESTING W REFLEX): HIV Screen 4th Generation wRfx: NONREACTIVE

## 2018-10-02 NOTE — Telephone Encounter (Signed)
-----   Message from Trey Sailors, [place] sent at 10/02/2018  4:49 PM EDT ----- Chlamydia testing was negative.

## 2018-10-02 NOTE — Telephone Encounter (Signed)
-----   Message from Trey Sailors, [place] sent at 10/02/2018 10:39 AM EDT ----- Ian Phillips is all normal with the exception of slightly high LDL. Recommend reducing saturated fats and continuing exercise routine. Waiting on urine testing.

## 2018-10-02 NOTE — Telephone Encounter (Signed)
Na. Mail box is full. 

## 2018-10-07 NOTE — Telephone Encounter (Signed)
Na. Mail box full 

## 2018-10-09 NOTE — Telephone Encounter (Signed)
Mailed results  

## 2018-10-09 NOTE — Telephone Encounter (Signed)
Unable to reach patient voicemail box is full. KW 

## 2018-10-27 ENCOUNTER — Encounter: Payer: Self-pay | Admitting: Emergency Medicine

## 2018-10-27 ENCOUNTER — Emergency Department: Payer: BLUE CROSS/BLUE SHIELD

## 2018-10-27 ENCOUNTER — Emergency Department
Admission: EM | Admit: 2018-10-27 | Discharge: 2018-10-27 | Disposition: A | Discharge status: Home / Self Care | Payer: BLUE CROSS/BLUE SHIELD | Attending: Emergency Medicine | Admitting: Emergency Medicine

## 2018-10-27 DIAGNOSIS — Y999 Unspecified external cause status: Secondary | ICD-10-CM | POA: Insufficient documentation

## 2018-10-27 DIAGNOSIS — Y92009 Unspecified place in unspecified non-institutional (private) residence as the place of occurrence of the external cause: Secondary | ICD-10-CM | POA: Diagnosis not present

## 2018-10-27 DIAGNOSIS — S6991XA Unspecified injury of right wrist, hand and finger(s), initial encounter: Secondary | ICD-10-CM | POA: Diagnosis not present

## 2018-10-27 DIAGNOSIS — Z79899 Other long term (current) drug therapy: Secondary | ICD-10-CM | POA: Diagnosis not present

## 2018-10-27 DIAGNOSIS — Y9389 Activity, other specified: Secondary | ICD-10-CM | POA: Insufficient documentation

## 2018-10-27 DIAGNOSIS — W2209XA Striking against other stationary object, initial encounter: Secondary | ICD-10-CM | POA: Diagnosis not present

## 2018-10-27 DIAGNOSIS — S62306A Unspecified fracture of fifth metacarpal bone, right hand, initial encounter for closed fracture: Secondary | ICD-10-CM | POA: Diagnosis not present

## 2018-10-27 MED ORDER — IBUPROFEN 800 MG PO TABS
800.0000 mg | ORAL_TABLET | Freq: Once | ORAL | Status: AC
  Administered 2018-10-27: 800 mg via ORAL
  Filled 2018-10-27: qty 1

## 2018-10-27 MED ORDER — IBUPROFEN 800 MG PO TABS: 800.0000 mg | ORAL_TABLET | Freq: Three times a day (TID) | ORAL | 0 refills | Status: DC | PRN

## 2018-10-27 MED ORDER — OXYCODONE-ACETAMINOPHEN 7.5-325 MG PO TABS: 1.0000 | ORAL_TABLET | Freq: Four times a day (QID) | ORAL | 0 refills | Status: DC | PRN

## 2018-10-27 MED ORDER — OXYCODONE-ACETAMINOPHEN 5-325 MG PO TABS
1.0000 | ORAL_TABLET | Freq: Once | ORAL | Status: AC
  Administered 2018-10-27: 1 via ORAL
  Filled 2018-10-27: qty 1

## 2018-10-27 NOTE — ED Triage Notes (Signed)
Patient presents to ED via POV from home due to right hand injury. Patient reports on Thursday he punched a wall. Edema noted.

## 2018-10-27 NOTE — ED Provider Notes (Signed)
[facility] Emergency Department Provider Note   ____________________________________________   First MD Initiated Contact with Patient 10/27/18 1417     (approximate)  I have reviewed the triage vital signs and the nursing notes.   HISTORY  Chief Complaint Hand Injury    HPI Ian Phillips is a 22 y.o. male patient complain right hand pain secondary to punching a wall.  Incident occurred 4 days ago.  Patient has continued edema and pain.  Patient states Tylenol does not help with the pain.  Patient states no obvious deformity.  Patient is right-hand dominant.  Patient rates his pain as 8/10.  Patient described the pain is "achy".   Past Medical History:  Diagnosis Date  . Anxiety     There are no active problems to display for this patient.   History reviewed. No pertinent surgical history.  Prior to Admission medications   Medication Sig Start Date End Date Taking? Authorizing Provider  citalopram (CELEXA) 20 MG tablet Take 1 tablet (20 mg total) by mouth daily. 10/01/18 12/30/18  Trey Sailors, PA-C  ibuprofen (ADVIL,MOTRIN) 600 MG tablet Take 600 mg by mouth every 6 (six) hours as needed for mild pain or moderate pain.    [provider]  ibuprofen (ADVIL,MOTRIN) 800 MG tablet Take 1 tablet (800 mg total) by mouth every 8 (eight) hours as needed for moderate pain. 10/27/18   Joni Reining, PA-C  naproxen (NAPROSYN) 500 MG tablet Take 1 tablet (500 mg total) by mouth 2 (two) times daily with a meal. Patient not taking: Reported on 10/01/2018 06/24/18   Jene Every, MD  oxyCODONE-acetaminophen (PERCOCET) 7.5-325 MG tablet Take 1 tablet by mouth every 6 (six) hours as needed for severe pain. 10/27/18   Joni Reining, PA-C  traMADol (ULTRAM) 50 MG tablet Take 1 tablet (50 mg total) by mouth every 6 (six) hours as needed for moderate pain. Patient not taking: Reported on 10/01/2018 07/15/17   Joni Reining, PA-C     Allergies Patient has no known allergies.  Family History  Problem Relation Age of Onset  . Cancer Mother     Social History Social History   Tobacco Use  . Smoking status: Never Smoker  . Smokeless tobacco: Never Used  Substance Use Topics  . Alcohol use: No  . Drug use: No    Review of Systems Constitutional: No fever/chills Eyes: No visual changes. ENT: No sore throat. Cardiovascular: Denies chest pain. Respiratory: Denies shortness of breath. Gastrointestinal: No abdominal pain.  No nausea, no vomiting.  No diarrhea.  No constipation. Genitourinary: Negative for dysuria. Musculoskeletal: Right hand pain. Skin: Negative for rash. Neurological: Negative for headaches, focal weakness or numbness. Psychiatric:Anxiety. ____________________________________________   PHYSICAL EXAM:  VITAL SIGNS: ED Triage Vitals  Enc Vitals Group     BP 10/27/18 1353 (!) 155/89     Pulse Rate 10/27/18 1353 84     Resp 10/27/18 1353 16     Temp 10/27/18 1353 98 F (36.7 C)     Temp Source 10/27/18 1353 Oral     SpO2 10/27/18 1353 100 %     Weight 10/27/18 1354 218 lb (98.9 kg)     Height 10/27/18 1354 5\' 11"  (1.803 m)     Head Circumference --      Peak Flow --      Pain Score 10/27/18 1353 8     Pain Loc --      Pain Edu? --  Excl. in GC? --    Constitutional: Alert and oriented. Well appearing and in no acute distress. Cardiovascular: Normal rate, regular rhythm. Grossly normal heart sounds.  Good peripheral circulation. Respiratory: Normal respiratory effort.  No retractions. Lungs CTAB. Musculoskeletal: No obvious deformity for marked edema on aspect of right hand. Neurologic:  Normal speech and language. No gross focal neurologic deficits are appreciated. No gait instability. Skin:  Skin is warm, dry and intact. No rash noted. Psychiatric: Mood and affect are normal. Speech and behavior are normal.  ____________________________________________   LABS (all  labs ordered are listed, but only abnormal results are displayed)  Labs Reviewed - No data to display ____________________________________________  EKG   ____________________________________________  RADIOLOGY  ED MD interpretation:    Official radiology report(s): Dg Hand Complete Right  Result Date: 10/27/2018 CLINICAL DATA:  Fifth metacarpal pain and swelling after punching a wall. Initial encounter. EXAM: RIGHT HAND - COMPLETE 3+ VIEW COMPARISON:  None. FINDINGS: There is a minimally displaced fracture of the neck of the fifth metacarpal demonstrating moderate volar angulation. Overlying soft tissue swelling is noted. There is no dislocation. No radiopaque foreign body is evident. IMPRESSION: Angulated fifth metacarpal fracture. Electronically Signed   By: Sebastian Ache M.D.   On: 10/27/2018 14:22    ____________________________________________   PROCEDURES  Procedure(s) performed: None  .Splint Application Date/Time: 10/27/2018 2:32 PM Performed by: Lindajo Royal, NT Authorized by: Joni Reining, PA-C   Consent:    Consent obtained:  Verbal   Consent given by:  Patient   Risks discussed:  Numbness, pain and swelling Pre-procedure details:    Sensation:  Normal Procedure details:    Laterality:  Right   Location:  Hand   Hand:  R hand   Strapping: no     Splint type:  Ulnar gutter   Supplies:  Ortho-Glass Post-procedure details:    Pain:  Unchanged   Sensation:  Normal   Patient tolerance of procedure:  Tolerated well, no immediate complications    Critical Care performed: No  ____________________________________________   INITIAL IMPRESSION / ASSESSMENT AND PLAN / ED COURSE  As part of my medical decision making, I reviewed the following data within the electronic MEDICAL RECORD NUMBER    Right hand pain secondary to fifth metacarpal fracture.  Discussed x-ray findings with patient.  Patient placed in ulnar gutter splint.  Patient advised to follow  orthopedic for definitive evaluation and treatment.  Take medication as needed for pain.      ____________________________________________   FINAL CLINICAL IMPRESSION(S) / ED DIAGNOSES  Final diagnoses:  Closed displaced fracture of fifth metacarpal bone of right hand, unspecified portion of metacarpal, initial encounter     ED Discharge Orders         Ordered    oxyCODONE-acetaminophen (PERCOCET) 7.5-325 MG tablet  Every 6 hours PRN     10/27/18 1424    ibuprofen (ADVIL,MOTRIN) 800 MG tablet  Every 8 hours PRN     10/27/18 1424           Note:  This document was prepared using Dragon voice recognition software and may include unintentional dictation errors.    Joni Reining, PA-C 10/27/18 1434    Emily Filbert, MD 10/28/18 [phone]

## 2018-10-27 NOTE — Discharge Instructions (Addendum)
Wear splint until evaluation by orthopedics. °

## 2018-10-27 NOTE — ED Notes (Signed)
Pt presents with right hand pain and swelling after punching a wall on Thursday. Pt states he has been taking aleve for the pain, which is not really helping. Pt alert & oriented with NAD noted.

## 2018-10-27 NOTE — ED Notes (Signed)
Pt discharged home after verbalizing understanding of discharge instructions; nad noted. 

## 2018-10-29 DIAGNOSIS — S62336A Displaced fracture of neck of fifth metacarpal bone, right hand, initial encounter for closed fracture: Secondary | ICD-10-CM | POA: Diagnosis not present

## 2018-11-07 ENCOUNTER — Ambulatory Visit: Payer: BLUE CROSS/BLUE SHIELD | Admitting: Physician Assistant

## 2018-11-12 ENCOUNTER — Other Ambulatory Visit: Payer: Self-pay

## 2018-11-12 ENCOUNTER — Encounter: Payer: Self-pay | Admitting: Physician Assistant

## 2018-11-12 ENCOUNTER — Ambulatory Visit (INDEPENDENT_AMBULATORY_CARE_PROVIDER_SITE_OTHER): Payer: BLUE CROSS/BLUE SHIELD | Admitting: Physician Assistant

## 2018-11-12 VITALS — BP 128/92 | HR 80 | Temp 97.7°F | Ht 71.0 in | Wt 216.0 lb

## 2018-11-12 DIAGNOSIS — F419 Anxiety disorder, unspecified: Secondary | ICD-10-CM | POA: Diagnosis not present

## 2018-11-12 NOTE — Progress Notes (Signed)
Patient: Ian Phillips Male    DOB: 1996-03-02   22 y.o.   MRN: [id number] Visit Date: 11/12/2018  Today's Provider: Trey Sailors, PA-C   Chief Complaint  Patient presents with  . Medication follow up  . Hand Pain    right hand fracture 10/23/18   Subjective:    HPI  Pt presents here for follow up for his anxiety medication and he reports that it is working well and he seems to be sleeping better. He does not seem to notice a huge improvement in his mood but does feel overall he benefits from it. He plans on proposing to his girlfriend in April on a trip to [place] and he is excited about this.  Pt also reports that he broke his hand a few weeks ago 10/23/18 and he wants to discuss it with provider. He presented to the ER on 10/27/2018 and was diagnosed with right 5th metacarpal fracture. Has been to follow up with Hamilton Capri at [facility] orthopedics where he received a new brace and is to follow up in 2-3 weeks for repeat xray and exam. Still reports pain, reports discomfort with padding and the brace being too tight. Has ran out of Percocet currently and would like a refill.      No Known Allergies   Current Outpatient Medications:  .  citalopram (CELEXA) 20 MG tablet, Take 1 tablet (20 mg total) by mouth daily., Disp: 90 tablet, Rfl: 0 .  ibuprofen (ADVIL,MOTRIN) 800 MG tablet, Take 1 tablet (800 mg total) by mouth every 8 (eight) hours as needed for moderate pain., Disp: 15 tablet, Rfl: 0 .  oxyCODONE-acetaminophen (PERCOCET) 7.5-325 MG tablet, Take 1 tablet by mouth every 6 (six) hours as needed for severe pain., Disp: 12 tablet, Rfl: 0 .  ibuprofen (ADVIL,MOTRIN) 600 MG tablet, Take 600 mg by mouth every 6 (six) hours as needed for mild pain or moderate pain., Disp: , Rfl:  .  naproxen (NAPROSYN) 500 MG tablet, Take 1 tablet (500 mg total) by mouth 2 (two) times daily with a meal. (Patient not taking: Reported on 10/01/2018), Disp: 20 tablet, Rfl: 2 .  traMADol  (ULTRAM) 50 MG tablet, Take 1 tablet (50 mg total) by mouth every 6 (six) hours as needed for moderate pain. (Patient not taking: Reported on 10/01/2018), Disp: 12 tablet, Rfl: 0  Review of Systems  Constitutional: Negative.   HENT: Negative.   Eyes: Negative.   Respiratory: Negative.   Cardiovascular: Negative.   Gastrointestinal: Negative.   Endocrine: Negative.   Genitourinary: Negative.   Musculoskeletal: Positive for arthralgias (right hand pain from a fracture on 10/23/18). Negative for back pain, gait problem, joint swelling, myalgias, neck pain and neck stiffness.  Skin: Negative.   Allergic/Immunologic: Negative.   Neurological: Negative.   Hematological: Negative.   Psychiatric/Behavioral: Negative.     Social History   Tobacco Use  . Smoking status: Never Smoker  . Smokeless tobacco: Never Used  Substance Use Topics  . Alcohol use: No   Objective:   BP (!) 128/92 (BP Location: Left Arm, Patient Position: Sitting, Cuff Size: Normal)   Pulse 80   Temp 97.7 F (36.5 C) (Oral)   Ht 5\' 11"  (1.803 m)   Wt 216 lb (98 kg)   SpO2 97%   BMI 30.13 kg/m  Vitals:   11/12/18 1110  BP: (!) 128/92  Pulse: 80  Temp: 97.7 F (36.5 C)  TempSrc: Oral  SpO2: 97%  Weight: 216 lb (98 kg)  Height: 5\' 11"  (1.803 m)     Physical Exam  Constitutional: He is oriented to person, place, and time. He appears well-developed and well-nourished.  Cardiovascular: Normal rate and regular rhythm.  Pulmonary/Chest: Effort normal and breath sounds normal.  Musculoskeletal:  Right hand in ulnar gutter splint.   Neurological: He is alert and oriented to person, place, and time.  Skin: Skin is warm and dry.  Psychiatric: He has a normal mood and affect. His behavior is normal.        Assessment & Plan:     1. Anxiety  Improved on celexa, he wishes to continue the current dosage of 20 mg daily. Advised him to contact orthopedics about pain management and padding the brace to be more  comfortable.  Return in about 6 months (around 05/13/2019).  The entirety of the information documented in the History of Present Illness, Review of Systems and Physical Exam were personally obtained by me. Portions of this information were initially documented by Rondel BatonSulibeya Dimas, CMA and reviewed by me for thoroughness and accuracy.          Trey SailorsAdriana M Pollak, PA-C  [facility]

## 2018-11-12 NOTE — Patient Instructions (Signed)
[phone] - orthopedic number     Generalized Anxiety Disorder, Adult Generalized anxiety disorder (GAD) is a mental health disorder. People with this condition constantly worry about everyday events. Unlike normal anxiety, worry related to GAD is not triggered by a specific event. These worries also do not fade or get better with time. GAD interferes with life functions, including relationships, work, and school. GAD can vary from mild to severe. People with severe GAD can have intense waves of anxiety with physical symptoms (panic attacks). What are the causes? The exact cause of GAD is not known. What increases the risk? This condition is more likely to develop in:  Women.  People who have a family history of anxiety disorders.  People who are very shy.  People who experience very stressful life events, such as the death of a loved one.  People who have a very stressful family environment.  What are the signs or symptoms? People with GAD often worry excessively about many things in their lives, such as their health and family. They may also be overly concerned about:  Doing well at work.  Being on time.  Natural disasters.  Friendships.  Physical symptoms of GAD include:  Fatigue.  Muscle tension or having muscle twitches.  Trembling or feeling shaky.  Being easily startled.  Feeling like your heart is pounding or racing.  Feeling out of breath or like you cannot take a deep breath.  Having trouble falling asleep or staying asleep.  Sweating.  Nausea, diarrhea, or irritable bowel syndrome (IBS).  Headaches.  Trouble concentrating or remembering facts.  Restlessness.  Irritability.  How is this diagnosed? Your health care provider can diagnose GAD based on your symptoms and medical history. You will also have a physical exam. The health care provider will ask specific questions about your symptoms, including how severe they are, when they started,  and if they come and go. Your health care provider may ask you about your use of alcohol or drugs, including prescription medicines. Your health care provider may refer you to a mental health specialist for further evaluation. Your health care provider will do a thorough examination and may perform additional tests to rule out other possible causes of your symptoms. To be diagnosed with GAD, a person must have anxiety that:  Is out of his or her control.  Affects several different aspects of his or her life, such as work and relationships.  Causes distress that makes him or her unable to take part in normal activities.  Includes at least three physical symptoms of GAD, such as restlessness, fatigue, trouble concentrating, irritability, muscle tension, or sleep problems.  Before your health care provider can confirm a diagnosis of GAD, these symptoms must be present more days than they are not, and they must last for six months or longer. How is this treated? The following therapies are usually used to treat GAD:  Medicine. Antidepressant medicine is usually prescribed for long-term daily control. Antianxiety medicines may be added in severe cases, especially when panic attacks occur.  Talk therapy (psychotherapy). Certain types of talk therapy can be helpful in treating GAD by providing support, education, and guidance. Options include: ? Cognitive behavioral therapy (CBT). People learn coping skills and techniques to ease their anxiety. They learn to identify unrealistic or negative thoughts and behaviors and to replace them with positive ones. ? Acceptance and commitment therapy (ACT). This treatment teaches people how to be mindful as a way to cope with unwanted thoughts and  feelings. ? Biofeedback. This process trains you to manage your body's response (physiological response) through breathing techniques and relaxation methods. You will work with a therapist while machines are used to  monitor your physical symptoms.  Stress management techniques. These include yoga, meditation, and exercise.  A mental health specialist can help determine which treatment is best for you. Some people see improvement with one type of therapy. However, other people require a combination of therapies. Follow these instructions at home:  Take over-the-counter and prescription medicines only as told by your health care provider.  Try to maintain a normal routine.  Try to anticipate stressful situations and allow extra time to manage them.  Practice any stress management or self-calming techniques as taught by your health care provider.  Do not punish yourself for setbacks or for not making progress.  Try to recognize your accomplishments, even if they are small.  Keep all follow-up visits as told by your health care provider. This is important. Contact a health care provider if:  Your symptoms do not get better.  Your symptoms get worse.  You have signs of depression, such as: ? A persistently sad, cranky, or irritable mood. ? Loss of enjoyment in activities that used to bring you joy. ? Change in weight or eating. ? Changes in sleeping habits. ? Avoiding friends or family members. ? Loss of energy for normal tasks. ? Feelings of guilt or worthlessness. Get help right away if:  You have serious thoughts about hurting yourself or others. If you ever feel like you may hurt yourself or others, or have thoughts about taking your own life, get help right away. You can go to your nearest emergency department or call:  Your local emergency services (911 in the [place]  A suicide crisis helpline, such as the National Suicide Prevention Lifeline at 615-468-9458. This is open 24 hours a day.  Summary  Generalized anxiety disorder (GAD) is a mental health disorder that involves worry that is not triggered by a specific event.  People with GAD often worry excessively about many things  in their lives, such as their health and family.  GAD may cause physical symptoms such as restlessness, trouble concentrating, sleep problems, frequent sweating, nausea, diarrhea, headaches, and trembling or muscle twitching.  A mental health specialist can help determine which treatment is best for you. Some people see improvement with one type of therapy. However, other people require a combination of therapies. This information is not intended to replace advice given to you by your health care provider. Make sure you discuss any questions you have with your health care provider. Document Released: 03/30/2013 Document Revised: 10/23/2016 Document Reviewed: 10/23/2016 Elsevier Interactive Patient Education  [employer].

## 2019-02-26 DIAGNOSIS — K5289 Other specified noninfective gastroenteritis and colitis: Secondary | ICD-10-CM | POA: Diagnosis not present

## 2019-02-26 DIAGNOSIS — J00 Acute nasopharyngitis [common cold]: Secondary | ICD-10-CM | POA: Diagnosis not present

## 2019-02-26 DIAGNOSIS — R112 Nausea with vomiting, unspecified: Secondary | ICD-10-CM | POA: Diagnosis not present

## 2019-03-12 ENCOUNTER — Encounter: Payer: Self-pay | Admitting: Physician Assistant

## 2019-03-12 ENCOUNTER — Ambulatory Visit (INDEPENDENT_AMBULATORY_CARE_PROVIDER_SITE_OTHER): Payer: BLUE CROSS/BLUE SHIELD | Admitting: Physician Assistant

## 2019-03-12 ENCOUNTER — Other Ambulatory Visit: Payer: Self-pay

## 2019-03-12 VITALS — BP 139/85 | HR 71 | Temp 97.8°F | Resp 16 | Ht 71.0 in | Wt 229.0 lb

## 2019-03-12 DIAGNOSIS — F419 Anxiety disorder, unspecified: Secondary | ICD-10-CM | POA: Diagnosis not present

## 2019-03-12 DIAGNOSIS — K529 Noninfective gastroenteritis and colitis, unspecified: Secondary | ICD-10-CM | POA: Diagnosis not present

## 2019-03-12 NOTE — Progress Notes (Signed)
Patient: Ian Phillips Male    DOB: 01-Jan-1996   23 y.o.   MRN: [id number] Visit Date: 03/12/2019  Today's Provider: Trey Sailors, PA-C   Chief Complaint  Patient presents with  . Follow-up   Subjective:     HPI Follow up evaluation: Patient comes in today to have paper work completed for insurance. He missed two days of work due to GI illness on 02/25/2019 and 02/26/2019, and they require PCP documentation of why he missed work. Reports he experience nausea and vomiting, was seen at urgent care and got Rx for immodium and zofran. Feels much better, has returned to work since then on 03/01/2019 as he was scheduled off 02/27/2019 and 02/28/2019. No fevers, chills, nausea, vomiting, or diarrhea.  Anxiety and Depression  Patient has questions about his Celexa today. He reports he is no longer taking this, reports it helped his sleep but didn't help his mood and so he stopped taking it. He wants to know if celexa causes nightmares. He says he has been having very bad dreams. When questioned further, he says that actually he has been seeing people he has known who have since died as if they are standing next to him. He reports that other people do not see these people. Reports one person he sees has been dead for at least 3 years now and he denies acute grief associated with this. He thought these might be "day dreams." He is not sure how to answer questions regarding whether he hears voices. He reports he feels like he hears his own voice. When questioned about whether this is his internal monologue, he reports he is unsure, he feels it is saying things that are "not like him." Denies command hallucinations. Denies using any drugs or alcohol. Denies SI/HI.  No Known Allergies   Current Outpatient Medications:  .  ibuprofen (ADVIL,MOTRIN) 600 MG tablet, Take 600 mg by mouth every 6 (six) hours as needed for mild pain or moderate pain., Disp: , Rfl:  .  citalopram (CELEXA) 20 MG  tablet, Take 1 tablet (20 mg total) by mouth daily., Disp: 90 tablet, Rfl: 0  Review of Systems  Constitutional: Negative for appetite change, chills and fever.  Respiratory: Negative for chest tightness, shortness of breath and wheezing.   Cardiovascular: Negative for chest pain and palpitations.  Gastrointestinal: Negative for abdominal pain, nausea and vomiting.    Social History   Tobacco Use  . Smoking status: Never Smoker  . Smokeless tobacco: Never Used  Substance Use Topics  . Alcohol use: No      Objective:   BP 139/85 (BP Location: Right Arm, Patient Position: Sitting, Cuff Size: Large)   Pulse 71   Temp 97.8 F (36.6 C) (Oral)   Resp 16   Ht 5\' 11"  (1.803 m)   Wt 229 lb (103.9 kg)   SpO2 97% Comment: room air  BMI 31.94 kg/m  Vitals:   03/12/19 0815  BP: 139/85  Pulse: 71  Resp: 16  Temp: 97.8 F (36.6 C)  TempSrc: Oral  SpO2: 97%  Weight: 229 lb (103.9 kg)  Height: 5\' 11"  (1.803 m)     Physical Exam Constitutional:      Appearance: Normal appearance.  Cardiovascular:     Rate and Rhythm: Normal rate.  Pulmonary:     Effort: Pulmonary effort is normal.     Breath sounds: Normal breath sounds.  Abdominal:     General: Abdomen is flat.  Bowel sounds are normal.  Skin:    General: Skin is warm and dry.  Neurological:     Mental Status: He is alert and oriented to person, place, and time. Mental status is at baseline.  Psychiatric:        Attention and Perception: He perceives visual hallucinations. He does not perceive auditory hallucinations.        Mood and Affect: Mood normal.        Speech: Speech normal.        Behavior: Behavior normal.     Comments: Patient appears well groom and has an appropriate affect. Does not appear to be responding to internal stimuli.          Assessment & Plan    1. Anxiety  Patient describes visual hallucinations of deceased friends that have died many years ago, does not appear to be associated with  acute grief reaction, drugs, or alcohol. Patient does seem to have appropriate affect in office and functions well in his job, still dating his girlfriend though they have decided not to get married. Will refer to psychiatry for further evaluation.  - Ambulatory referral to Psychiatry  2. Gastroenteritis  Work note provided.   The entirety of the information documented in the History of Present Illness, Review of Systems and Physical Exam were personally obtained by me. Portions of this information were initially documented by Awilda Bill, CMA and reviewed by me for thoroughness and accuracy.   F/u Prn.       Trey Sailors, PA-C  [facility]

## 2019-03-12 NOTE — Patient Instructions (Signed)
Viral Gastroenteritis, Adult    Viral gastroenteritis is also known as the stomach flu. This condition is caused by certain germs (viruses). These germs can be passed from person to person very easily (are very contagious). This condition can cause sudden watery poop (diarrhea), fever, and throwing up (vomiting).  Having watery poop and throwing up can make you feel weak and cause you to get dehydrated. Dehydration can make you tired and thirsty, make you have a dry mouth, and make it so you pee (urinate) less often. Older adults and people with other diseases or a weak defense system (immune system) are at higher risk for dehydration. It is important to replace the fluids that you lose from having watery poop and throwing up.  Follow these instructions at home:  Follow instructions from your doctor about how to care for yourself at home.  Eating and drinking  Follow these instructions as told by your doctor:   Take an oral rehydration solution (ORS). This is a drink that is sold at pharmacies and stores.   Drink clear fluids in small amounts as you are able, such as:  ? Water.  ? Ice chips.  ? Diluted fruit juice.  ? Low-calorie sports drinks.   Eat bland, easy-to-digest foods in small amounts as you are able, such as:  ? Bananas.  ? Applesauce.  ? Rice.  ? Low-fat (lean) meats.  ? Toast.  ? Crackers.   Avoid fluids that have a lot of sugar or caffeine in them.   Avoid alcohol.   Avoid spicy or fatty foods.  General instructions     Drink enough fluid to keep your pee (urine) clear or pale yellow.   Wash your hands often. If you cannot use soap and water, use hand sanitizer.   Make sure that all people in your home wash their hands well and often.   Rest at home while you get better.   Take over-the-counter and prescription medicines only as told by your doctor.   Watch your condition for any changes.   Take a warm bath to help with any burning or pain from having watery poop.   Keep all follow-up  visits as told by your doctor. This is important.  Contact a doctor if:   You cannot keep fluids down.   Your symptoms get worse.   You have new symptoms.   You feel light-headed or dizzy.   You have muscle cramps.  Get help right away if:   You have chest pain.   You feel very weak or you pass out (faint).   You see blood in your throw-up.   Your throw-up looks like coffee grounds.   You have bloody or black poop (stools) or poop that look like tar.   You have a very bad headache, a stiff neck, or both.   You have a rash.   You have very bad pain, cramping, or bloating in your belly (abdomen).   You have trouble breathing.   You are breathing very quickly.   Your heart is beating very quickly.   Your skin feels cold and clammy.   You feel confused.   You have pain when you pee.   You have signs of dehydration, such as:  ? Dark pee, hardly any pee, or no pee.  ? Cracked lips.  ? Dry mouth.  ? Sunken eyes.  ? Sleepiness.  ? Weakness.  This information is not intended to replace advice given to you by your   health care provider. Make sure you discuss any questions you have with your health care provider.  Document Released: 05/21/2008 Document Revised: 08/27/2018 Document Reviewed: 08/09/2015  Elsevier Interactive Patient Education  2019 [employer] Inc[employer]

## 2019-09-17 DIAGNOSIS — L0591 Pilonidal cyst without abscess: Secondary | ICD-10-CM | POA: Diagnosis not present

## 2019-10-27 DIAGNOSIS — Z03818 Encounter for observation for suspected exposure to other biological agents ruled out: Secondary | ICD-10-CM | POA: Diagnosis not present

## 2019-10-27 DIAGNOSIS — Z20828 Contact with and (suspected) exposure to other viral communicable diseases: Secondary | ICD-10-CM | POA: Diagnosis not present

## 2019-11-09 ENCOUNTER — Encounter (HOSPITAL_COMMUNITY): Payer: Self-pay

## 2019-11-09 ENCOUNTER — Ambulatory Visit: Payer: Self-pay

## 2019-11-09 ENCOUNTER — Emergency Department (HOSPITAL_COMMUNITY)
Admission: EM | Admit: 2019-11-09 | Discharge: 2019-11-09 | Disposition: A | Discharge status: Home / Self Care | Payer: BC Managed Care – PPO | Attending: Emergency Medicine | Admitting: Emergency Medicine

## 2019-11-09 ENCOUNTER — Other Ambulatory Visit: Payer: Self-pay

## 2019-11-09 ENCOUNTER — Telehealth: Payer: Self-pay

## 2019-11-09 DIAGNOSIS — K602 Anal fissure, unspecified: Secondary | ICD-10-CM

## 2019-11-09 DIAGNOSIS — K648 Other hemorrhoids: Secondary | ICD-10-CM | POA: Diagnosis not present

## 2019-11-09 DIAGNOSIS — K640 First degree hemorrhoids: Secondary | ICD-10-CM

## 2019-11-09 DIAGNOSIS — K5900 Constipation, unspecified: Secondary | ICD-10-CM

## 2019-11-09 DIAGNOSIS — K625 Hemorrhage of anus and rectum: Secondary | ICD-10-CM | POA: Diagnosis not present

## 2019-11-09 DIAGNOSIS — Z20818 Contact with and (suspected) exposure to other bacterial communicable diseases: Secondary | ICD-10-CM | POA: Diagnosis not present

## 2019-11-09 LAB — COMPREHENSIVE METABOLIC PANEL
ALT: 18 U/L (ref 0–44)
AST: 18 U/L (ref 15–41)
Albumin: 4.4 g/dL (ref 3.5–5.0)
Alkaline Phosphatase: 88 U/L (ref 38–126)
Anion gap: 9 (ref 5–15)
BUN: 13 mg/dL (ref 6–20)
CO2: 25 mmol/L (ref 22–32)
Calcium: 9.3 mg/dL (ref 8.9–10.3)
Chloride: 107 mmol/L (ref 98–111)
Creatinine, Ser: 0.89 mg/dL (ref 0.61–1.24)
GFR calc Af Amer: >60 mL/min (ref >60)
GFR calc non Af Amer: >60 mL/min (ref >60)
Glucose, Bld: 93 mg/dL (ref 70–99)
Potassium: 4.2 mmol/L (ref 3.5–5.1)
Sodium: 141 mmol/L (ref 135–145)
Total Bilirubin: 0.7 mg/dL (ref 0.3–1.2)
Total Protein: 7.4 g/dL (ref 6.5–8.1)

## 2019-11-09 LAB — CBC
HCT: 43.3 % (ref 39.0–52.0)
Hemoglobin: 14 g/dL (ref 13.0–17.0)
MCH: 28.6 pg (ref 26.0–34.0)
MCHC: 32.3 g/dL (ref 30.0–36.0)
MCV: 88.5 fL (ref 80.0–100.0)
Platelets: 242 10*3/uL (ref 150–400)
RBC: 4.89 MIL/uL (ref 4.22–5.81)
RDW: 12.6 % (ref 11.5–15.5)
WBC: 6.5 10*3/uL (ref 4.0–10.5)
nRBC: 0 % (ref 0.0–0.2)

## 2019-11-09 LAB — TYPE AND SCREEN
ABO/RH(D): O NEG
Antibody Screen: NEGATIVE

## 2019-11-09 LAB — POC OCCULT BLOOD, ED: Fecal Occult Bld: POSITIVE — AB

## 2019-11-09 MED ORDER — POLYETHYLENE GLYCOL 3350 17 G PO PACK: 17.0000 g | PACK | Freq: Two times a day (BID) | ORAL | 0 refills | Status: DC

## 2019-11-09 MED ORDER — HYDROCORTISONE ACETATE 25 MG RE SUPP: 25.0000 mg | Freq: Two times a day (BID) | RECTAL | 0 refills | Status: DC

## 2019-11-09 MED ORDER — HYDROCORTISONE (PERIANAL) 2.5 % EX CREA: 1.0000 "application " | TOPICAL_CREAM | Freq: Two times a day (BID) | CUTANEOUS | 0 refills | Status: DC

## 2019-11-09 NOTE — Telephone Encounter (Signed)
Patient complains that last week he had abdominal pain, vomiting and then blood in his stool.  He described it as being dark red.  He continues today with some abdominal pain but does not know if there is any more blood because he just got up a little while ago and has not had a bowel movement yet.   We do not have any more appointments for today.  I recommended urgent care or ER but he stated he went to both of them and they were "at capacity and had a lot of people not wearing masks"  He would like to do a visit with you.  I gave him one for tomorrow but told him that it had to be ok with you to wait that long.  I also told him that I was not certain it would be an in person visit.  Please let him know before the end of the day and if he is to go to the ER we need to cancel and if not I did not put what type of visit. Thanks  [phone]

## 2019-11-09 NOTE — Telephone Encounter (Signed)
I agree, I do not think evaluation here virtually, which it would have to be due to COVID, would be adequate. He may be having GI bleed, gastric ulcer, severe intestinal infection which we are not able to work up easily here. I think he should be seen in the ER. If he absolutely won't go he can keep his appointment here though delays in care may affect his outcome negatively and there is no guarantee I can appropriately work him up as an outpatient.

## 2019-11-09 NOTE — ED Provider Notes (Addendum)
[facility]-EMERGENCY DEPT Provider Note   CSN: [id number] Arrival date & time: 11/09/19  1817     History   Chief Complaint Chief Complaint  Patient presents with  . Rectal Bleeding    HPI Ian Phillips is a 23 y.o. male with a hx of anxiety presents to the Emergency Department complaining of acute rectal pain and bleeding of dark red blood yesterday after painful, large, hard bowel movement.  Pt reports straining for greater than 30min to produce BM.  Pt reports brown stool with dark red blood.  Pt reports a hx of constipation and previously hard and painful bowel movements.  He reports he has had some similar rectal bleeding but never to the volume that he saw yesterday.  He reports he has had a small bowel movement today which was also painful with a small amount of blood.  He is not bleeding between bowel movements.  He reports some left lower quadrant abdominal fullness and cramping.  No significant pain.  Patient denies fever or chills.  He reports Friday he had some nausea and vomiting but this has resolved and he has been able to tolerate p.o. since that time.  No other Covid-like symptoms.  No treatments prior to arrival.      The history is provided by the patient and medical records. No language interpreter was used.    Past Medical History:  Diagnosis Date  . Anxiety     There are no active problems to display for this patient.   History reviewed. No pertinent surgical history.      Home Medications    Prior to Admission medications   Medication Sig Start Date End Date Taking? Authorizing Provider  ibuprofen (ADVIL,MOTRIN) 600 MG tablet Take 600 mg by mouth every 6 (six) hours as needed for mild pain or moderate pain.   Yes [provider]  citalopram (CELEXA) 20 MG tablet Take 1 tablet (20 mg total) by mouth daily. 10/01/18 12/30/18  Trey SailorsPollak, Adriana M, PA-C  hydrocortisone (ANUSOL-HC) 2.5 % rectal cream Place 1 application  rectally 2 (two) times daily. 11/09/19   Kanoe Wanner, Dahlia ClientHannah, PA-C  hydrocortisone (ANUSOL-HC) 25 MG suppository Place 1 suppository (25 mg total) rectally 2 (two) times daily. For 7 days 11/09/19   Sacred Roa, Dahlia ClientHannah, PA-C  polyethylene glycol (MIRALAX / GLYCOLAX) 17 g packet Take 17 g by mouth 2 (two) times daily. Until stool softens then reduce to 1x per day. 11/09/19   Joevon Holliman, Dahlia ClientHannah, PA-C    Family History Family History  Problem Relation Age of Onset  . Cancer Mother     Social History Social History   Tobacco Use  . Smoking status: Never Smoker  . Smokeless tobacco: Never Used  Substance Use Topics  . Alcohol use: No  . Drug use: No     Allergies   Patient has no known allergies.   Review of Systems Review of Systems  Constitutional: Negative for appetite change, diaphoresis, fatigue, fever and unexpected weight change.  HENT: Negative for mouth sores.   Eyes: Negative for visual disturbance.  Respiratory: Negative for cough, chest tightness, shortness of breath and wheezing.   Cardiovascular: Negative for chest pain.  Gastrointestinal: Positive for abdominal pain and anal bleeding. Negative for constipation, diarrhea, nausea and vomiting.  Endocrine: Negative for polydipsia, polyphagia and polyuria.  Genitourinary: Negative for dysuria, frequency, hematuria and urgency.  Musculoskeletal: Negative for back pain and neck stiffness.  Skin: Negative for rash.  Allergic/Immunologic: Negative for immunocompromised state.  Neurological: Negative for syncope, light-headedness and headaches.  Hematological: Does not bruise/bleed easily.  Psychiatric/Behavioral: Negative for sleep disturbance. The patient is not nervous/anxious.      Physical Exam Updated Vital Signs BP (!) 153/97   Pulse 95   Temp 98.1 F (36.7 C) (Oral)   Resp 18   SpO2 100%   Physical Exam Vitals signs and nursing note reviewed. Exam conducted with a chaperone present.   Constitutional:      General: He is not in acute distress.    Appearance: He is not diaphoretic.  HENT:     Head: Normocephalic.  Eyes:     General: No scleral icterus.    Conjunctiva/sclera: Conjunctivae normal.  Neck:     Musculoskeletal: Normal range of motion.  Cardiovascular:     Rate and Rhythm: Normal rate and regular rhythm.     Pulses: Normal pulses.          Radial pulses are 2+ on the right side and 2+ on the left side.  Pulmonary:     Effort: No tachypnea, accessory muscle usage, prolonged expiration, respiratory distress or retractions.     Breath sounds: No stridor.     Comments: Equal chest rise. No increased work of breathing. Abdominal:     General: There is no distension.     Palpations: Abdomen is soft.     Tenderness: There is abdominal tenderness in the left lower quadrant. There is no right CVA tenderness, left CVA tenderness, guarding or rebound.     Comments: Very mild left lower quadrant abdominal tenderness.   Genitourinary:    Prostate: Normal.     Rectum: Guaiac result positive. Tenderness, anal fissure and internal hemorrhoid present. No mass. Normal anal tone.    Musculoskeletal:     Comments: Moves all extremities equally and without difficulty.  Skin:    General: Skin is warm and dry.     Capillary Refill: Capillary refill takes less than 2 seconds.  Neurological:     Mental Status: He is alert.     GCS: GCS eye subscore is 4. GCS verbal subscore is 5. GCS motor subscore is 6.     Comments: Speech is clear and goal oriented.  Psychiatric:        Mood and Affect: Mood normal.      ED Treatments / Results  Labs (all labs ordered are listed, but only abnormal results are displayed) Labs Reviewed  POC OCCULT BLOOD, ED - Abnormal; Notable for the following components:      Result Value   Fecal Occult Bld POSITIVE (*)    All other components within normal limits  COMPREHENSIVE METABOLIC PANEL  CBC  TYPE AND SCREEN  ABO/RH      Procedures Procedures (including critical care time)  Medications Ordered in ED Medications - No data to display   Initial Impression / Assessment and Plan / ED Course  I have reviewed the triage vital signs and the nursing notes.  Pertinent labs & imaging results that were available during my care of the patient were reviewed by me and considered in my medical decision making (see chart for details).  Clinical Course as of Nov 08 2338  Molli Knock Nov 09, 2019  2338 Hypertension noted however patient is very anxious.  Suspect whitecoat syndrome.  BP(!): 153/97 [HM]    Clinical Course User Index [HM] Currie Dennin, Jarrett Soho, PA-C       Patient presents for rectal bleeding.  On exam he has 2 small rectal  fissures and stage I hemorrhoid.  As this came after a large hard bowel movement these were the expected findings.  No signs of rectal abscess or cellulitis.  No evidence of rectal fistula.  Abdomen is soft and only minimally tender.  Given history of constipation, description of abdominal discomfort and exam findings I suspect this is the cause.  He has no fever or chills.  He has no history of diverticulitis and I doubt that is the reason today.  Less likely to be colitis appendicitis, bowel perforation.  Patient will need close primary care follow-up.  He will need to return to the emergency department for new or worsening symptoms, persistent bleeding.  Discussed with patient.  He states understanding and is in agreement with the plan.  Final Clinical Impressions(s) / ED Diagnoses   Final diagnoses:  Anal fissure  Grade I hemorrhoids  Constipation, unspecified constipation type    ED Discharge Orders         Ordered    hydrocortisone (ANUSOL-HC) 25 MG suppository  2 times daily     11/09/19 2329    hydrocortisone (ANUSOL-HC) 2.5 % rectal cream  2 times daily     11/09/19 2329    polyethylene glycol (MIRALAX / GLYCOLAX) 17 g packet  2 times daily     11/09/19 2329            Kamillah Didonato, Boyd Kerbs 11/09/19 2339    Derwood Kaplan, MD 11/10/19 1759

## 2019-11-09 NOTE — Telephone Encounter (Addendum)
Patient was advised and states that he will go to the ER for evaluation. FYI

## 2019-11-09 NOTE — ED Triage Notes (Signed)
Patient arrived stating on Friday he began having lower abdominal pain. On Saturday he started vomiting. Reporting that yesterday he had a formed bowel movement with dark red blood. Denies any vomiting today. Also reporting he had fish prior to the abdominal pain and unsure if it is related.

## 2019-11-09 NOTE — Telephone Encounter (Signed)
Patient called stating that he passed blood in his stool yesterday two different times. He state the water turned dark red. He states that Saturday he vomited and was sick. Yesterday no more nausea but two diarrhea stools with dark red blood. He has lower abdominal pain that causes him not to want to sit   Any bending forward causes the pain. He state that he usually has issues with constipation. He has not passed stool or blood today. He does not take blood thinners. He states that he and his girlfriend tested negative to COVID-19  2 weeks ago after an exposure. Care advice read to patient. He verbalized understanding. Call transferred to office for scheduling.  Reason for Disposition . MODERATE rectal bleeding (small blood clots, passing blood without stool, or toilet water turns red)  Answer Assessment - Initial Assessment Questions 1. APPEARANCE of BLOOD: "What color is it?" "Is it passed separately, on the surface of the stool, or mixed in with the stool?"      mixed 2. AMOUNT: "How much blood was passed?"     Very red 3. FREQUENCY: "How many times has blood been passed with the stools?"     Yesterday 2 times 4. ONSET: "When was the blood first seen in the stools?" (Days or weeks)     yesterday 5. DIARRHEA: "Is there also some diarrhea?" If so, ask: "How many diarrhea stools were passed in past 24 hours?"      Diarrhea since Saturday 5-6 stools only 2 yesterday were bloody 6. CONSTIPATION: "Do you have constipation?" If so, "How bad is it?"    no 7. RECURRENT SYMPTOMS: "Have you had blood in your stools before?" If so, ask: "When was the last time?" and "What happened that time?"      Seen it with constipation but not to this extent 8. BLOOD THINNERS: "Do you take any blood thinners?" (e.g., Coumadin/warfarin, Pradaxa/dabigatran, aspirin)     no 9. OTHER SYMPTOMS: "Do you have any other symptoms?"  (e.g., abdominal pain, vomiting, dizziness, fever)     Lower abdominal pain mid lower  stomach 10. PREGNANCY: "Is there any chance you are pregnant?" "When was your last menstrual period?"      N/A  Protocols used: RECTAL BLEEDING-A-AH

## 2019-11-09 NOTE — Discharge Instructions (Addendum)
1. Medications: Anusol cream and suppository, usual home medications 2. Treatment: rest, drink plenty of fluids, take daily fiber supplement (Benefiber) 3. Follow Up: Please followup with your primary doctor in 2-3 days for discussion of your diagnoses and further evaluation after today's visit; if you do not have a primary care doctor use the resource guide provided to find one; Please return to the ER for persistent bleeding, recurrent or worsening abdominal pain or other concerns.

## 2019-11-10 ENCOUNTER — Ambulatory Visit: Payer: Self-pay | Admitting: Physician Assistant

## 2019-11-10 LAB — ABO/RH: ABO/RH(D): O NEG

## 2020-01-18 DIAGNOSIS — Z20828 Contact with and (suspected) exposure to other viral communicable diseases: Secondary | ICD-10-CM | POA: Diagnosis not present

## 2020-11-22 ENCOUNTER — Ambulatory Visit: Payer: BLUE CROSS/BLUE SHIELD | Admitting: Physician Assistant

## 2020-11-23 ENCOUNTER — Encounter: Payer: Self-pay | Admitting: Physician Assistant

## 2020-11-23 ENCOUNTER — Other Ambulatory Visit: Payer: Self-pay

## 2020-11-23 ENCOUNTER — Ambulatory Visit (INDEPENDENT_AMBULATORY_CARE_PROVIDER_SITE_OTHER): Payer: BC Managed Care – PPO | Admitting: Physician Assistant

## 2020-11-23 VITALS — BP 146/86 | HR 88 | Temp 98.4°F | Resp 16 | Ht 72.0 in | Wt 226.0 lb

## 2020-11-23 DIAGNOSIS — G4700 Insomnia, unspecified: Secondary | ICD-10-CM | POA: Diagnosis not present

## 2020-11-23 DIAGNOSIS — F419 Anxiety disorder, unspecified: Secondary | ICD-10-CM | POA: Diagnosis not present

## 2020-11-23 DIAGNOSIS — F32A Depression, unspecified: Secondary | ICD-10-CM

## 2020-11-23 DIAGNOSIS — Z113 Encounter for screening for infections with a predominantly sexual mode of transmission: Secondary | ICD-10-CM

## 2020-11-23 DIAGNOSIS — Z23 Encounter for immunization: Secondary | ICD-10-CM

## 2020-11-23 NOTE — Progress Notes (Signed)
Established patient visit   Patient: Ian Phillips   DOB: 1996-12-08   24 y.o. Male  MRN: [id number] Visit Date: 11/23/2020  Today's healthcare provider: Trey Sailors, PA-C   Chief Complaint  Patient presents with  . Anxiety   Subjective    HPI  Anxiety, Follow-up  He was last seen for anxiety 1 years ago. Changes made at last visit include continue current medication. However, patient reports that he no longer takes citalopram.   He reports good compliance with treatment. He reports fair tolerance of treatment. He is having side effects.  Patient reports not sleeping well and increased anxiety. Currently working and has been advised by employer to be seen by a provider. 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 He reports there is an absence issue. He has been having trouble sleeping and will sleep through alarms. He has been going 1-2 days without being to sleep as it has been continuing for one to two months. He reports trying to relax with noise machine and ocean sounds. He reports he lays in bed with eyes closed but cannot fall asleep. Patient reports he will zone out more easily right now. Patient reports his friend has told him he will talk with no filter and not stop talking. Aunt with bipolar depression and mother with suspected bipolar depression.   He has started drinking more recently - 10 drinks daily ranging from 6-7 beers and a couple of mixed drinks every day or other day.   Reports a number of stressors including work, death of grandfather, end of long term relationship.   He feels his anxiety is moderate and Worse since last visit.  Symptoms: No chest pain Yes difficulty concentrating  No dizziness Yes fatigue  No feelings of losing control Yes insomnia  Yes irritable No palpitations  No panic attacks No racing thoughts  No shortness of breath No sweating  No tremors/shakes    GAD-7 Results No flowsheet data found.  PHQ-9 Scores PHQ9  SCORE ONLY 11/12/2018 10/01/2018  PHQ-9 Total Score 0 1    Has questions about STI exposures.      Medications: Outpatient Medications Prior to Visit  Medication Sig  . ibuprofen (ADVIL,MOTRIN) 600 MG tablet Take 600 mg by mouth every 6 (six) hours as needed for mild pain or moderate pain.  . polyethylene glycol (MIRALAX / GLYCOLAX) 17 g packet Take 17 g by mouth 2 (two) times daily. Until stool softens then reduce to 1x per day.  . citalopram (CELEXA) 20 MG tablet Take 1 tablet (20 mg total) by mouth daily.  . hydrocortisone (ANUSOL-HC) 2.5 % rectal cream Place 1 application rectally 2 (two) times daily. (Patient not taking: Reported on 11/23/2020)  . hydrocortisone (ANUSOL-HC) 25 MG suppository Place 1 suppository (25 mg total) rectally 2 (two) times daily. For 7 days (Patient not taking: Reported on 11/23/2020)   No facility-administered medications prior to visit.    Review of Systems  Constitutional: Positive for activity change and fatigue.  Respiratory: Negative.   Cardiovascular: Negative.   Neurological: Negative.   Psychiatric/Behavioral: Positive for agitation and sleep disturbance. Negative for self-injury. The patient is nervous/anxious.       Objective    BP (!) 146/86   Pulse 88   Temp 98.4 F (36.9 C)   Resp 16   Ht 6' (1.829 m)   Wt 226 lb (102.5 kg)   BMI 30.65 kg/m    Physical Exam Constitutional:  Appearance: Normal appearance.  Cardiovascular:     Rate and Rhythm: Normal rate and regular rhythm.     Heart sounds: Normal heart sounds.  Pulmonary:     Effort: Pulmonary effort is normal.     Breath sounds: Normal breath sounds.  Skin:    General: Skin is warm and dry.  Neurological:     Mental Status: He is alert and oriented to person, place, and time. Mental status is at baseline.  Psychiatric:        Mood and Affect: Mood normal.        Behavior: Behavior normal.       No results found for any visits on 11/23/20.  Assessment & Plan     1. Insomnia, unspecified type  Suspect bipolar depression. Advised on psychiatry and counseling. Offered to start abilify today but patient would rather wait until seeing psychiatrist. Stressed importance of following up with mental health providers. Will fill out FMLA forms for duration of 6 months.  - Ambulatory referral to Psychology  2. Anxiety and depression  - Ambulatory referral to Psychiatry - Ambulatory referral to Psychology  3. Routine screening for STI (sexually transmitted infection)   4. Need for influenza vaccination  - Flu Vaccine QUAD 6+ mos PF IM (Fluarix Quad PF)   Return if symptoms worsen or fail to improve.      I spent 30 minutes dedicated to the care of this patient on the date of this encounter to include pre-visit review of records, face-to-face time with the patient discussing bipolar depression, and post visit ordering of testing.   ITrey Sailors, PA-C, have reviewed all documentation for this visit. The documentation on 11/23/20 for the exam, diagnosis, procedures, and orders are all accurate and complete.  The entirety of the information documented in the History of Present Illness, Review of Systems and Physical Exam were personally obtained by me. Portions of this information were initially documented by [facility] and reviewed by me for thoroughness and accuracy.   I spent 30 minutes dedicated to the care of this patient on the date of this encounter to include pre-visit review of records, face-to-face time with the patient discussing bipolar depression, and post visit ordering of testing.   Maryella Shivers  [facility] [phone] (phone) [phone] (fax)  [facility]

## 2020-11-23 NOTE — Patient Instructions (Signed)

## 2020-12-02 ENCOUNTER — Telehealth: Payer: Self-pay

## 2020-12-02 NOTE — Telephone Encounter (Signed)
Copied from CRM [id number]. Topic: General - Other >> Dec 02, 2020  2:56 PM Dalphine Handing A wrote: Patient requesting callback with update on paperwork given to [facility] for his employer. Employer stated that the paperwork hasnt been received yet. Please advise

## 2020-12-03 ENCOUNTER — Encounter: Payer: Self-pay | Admitting: Physician Assistant

## 2020-12-08 NOTE — Telephone Encounter (Signed)
Copied from CRM [id number]. Topic: General - Other >> Dec 02, 2020  2:56 PM Dalphine Handing A wrote: Patient requesting callback with update on paperwork given to [facility] [employer] for his employer. Employer stated that the paperwork hasnt been received yet. Please advise >> Dec 08, 2020  2:47 PM Tamela Oddi wrote: Patient is calling again to check the status of his FMLA paperwork.  CB# [phone]

## 2020-12-13 NOTE — Telephone Encounter (Signed)
Form completed. Psychiatry and psychology have both tried to contact him on 11/25/2020. He will need to schedule up with them for further follow up and eval.

## 2022-02-19 ENCOUNTER — Ambulatory Visit: Payer: Self-pay

## 2022-02-19 NOTE — Telephone Encounter (Signed)
?  Chief Complaint: STI exposure ?Symptoms: gential itchy, discomfort, and L testicle pain ?Frequency: 1 week ago ?Pertinent Negatives: Patient denies discharge ?Disposition: [] ED /[] Urgent Care (no appt availability in office) / [x] Appointment(In office/virtual)/ []  [facility]/ [] Home Care/ [] Refused Recommended Disposition /[] [place] [facility]/ []  Follow-up with PCP ?Additional Notes: pt was asking about STI testing since he is unsure what this is.  ? ?Reason for Disposition ? Sexual intercourse (oral, vaginal, or anal) with someone who was diagnosed with or is suspected of having a STI ? ?Answer Assessment - Initial Assessment Questions ?1. STI EXPOSURE: "Were you exposed to an STI?" If so, ask: "What kind of contact did you have?" "When did that happen?" ?    1 week ago, sexual no protection ?2. TYPE of STI: "Do you know what type of STI (their diagnosis) the other person has?" ?    No ?3. STI SYMPTOMS: "Do you have any symptoms of a STI?" If so, ask: "What are they?" ?    Genital itching, and discomfort  ?4. QUESTIONS ABOUT STIs:   "Do you have questions about a particular STI?" ?    *No Answer* ?5. PREVENTION: "Do you have questions about preventing AIDS and other STIs?" ? ?Protocols used: STI Exposure or Questions-P-AH ? ?

## 2022-02-21 ENCOUNTER — Encounter: Payer: Self-pay | Admitting: Physician Assistant

## 2022-02-21 ENCOUNTER — Ambulatory Visit (INDEPENDENT_AMBULATORY_CARE_PROVIDER_SITE_OTHER): Payer: BC Managed Care – PPO | Admitting: Physician Assistant

## 2022-02-21 ENCOUNTER — Other Ambulatory Visit (HOSPITAL_COMMUNITY)
Admission: RE | Admit: 2022-02-21 | Discharge: 2022-02-21 | Disposition: A | Discharge status: Home / Self Care | Payer: BC Managed Care – PPO | Source: Ambulatory Visit | Attending: Physician Assistant | Admitting: Physician Assistant

## 2022-02-21 ENCOUNTER — Other Ambulatory Visit: Payer: Self-pay

## 2022-02-21 VITALS — BP 144/92 | HR 75 | Ht 72.0 in | Wt 236.0 lb

## 2022-02-21 DIAGNOSIS — Z202 Contact with and (suspected) exposure to infections with a predominantly sexual mode of transmission: Secondary | ICD-10-CM

## 2022-02-21 DIAGNOSIS — F419 Anxiety disorder, unspecified: Secondary | ICD-10-CM

## 2022-02-21 DIAGNOSIS — F32A Depression, unspecified: Secondary | ICD-10-CM

## 2022-02-21 NOTE — Progress Notes (Signed)
? ?  ? ? ?Established patient visit ? ? ?Patient: Ian Phillips   DOB: 1996-03-28   26 y.o. Male  MRN: [id number] ?Visit Date: 02/21/2022 ? ?Today's healthcare provider: [name], PA-C  ? ?Subjective  ?  ?Exposure to STD ?The patient's primary symptoms include genital itching and testicular pain. The patient's pertinent negatives include no genital injury, genital lesions, pelvic pain, penile discharge, penile pain, priapism or scrotal swelling. This is a new problem. The current episode started in the past 7 days (4-7 days). The problem occurs 2 to 4 times per day. The problem has been unchanged. Associated symptoms include frequency. Pertinent negatives include no abdominal pain, anorexia, chest pain, chills, constipation, coughing, diarrhea, discolored urine, dysuria, fever, flank pain, headaches, hematuria, hesitancy, joint pain, joint swelling, nausea, painful intercourse, rash, shortness of breath, sore throat, urgency, urinary retention or vomiting. The testicular pain affects the left testicle. He is sexually active.  Pt reports partner stated she recently was positive for Chlamydia "wks ago" and was treated.  Intercourse x 2 wks ago. ? ?Medications: ?Outpatient Medications Prior to Visit  ?Medication Sig  ? hydrocortisone (ANUSOL-HC) 2.5 % rectal cream Place 1 application rectally 2 (two) times daily.  ? hydrocortisone (ANUSOL-HC) 25 MG suppository Place 1 suppository (25 mg total) rectally 2 (two) times daily. For 7 days  ? ibuprofen (ADVIL,MOTRIN) 600 MG tablet Take 600 mg by mouth every 6 (six) hours as needed for mild pain or moderate pain. (Patient not taking: Reported on 02/21/2022)  ? polyethylene glycol (MIRALAX / GLYCOLAX) 17 g packet Take 17 g by mouth 2 (two) times daily. Until stool softens then reduce to 1x per day.  ? [DISCONTINUED] citalopram (CELEXA) 20 MG tablet Take 1 tablet (20 mg total) by mouth daily.  ? ?No facility-administered medications prior to visit.  ? ? ?Review of  Systems  ?Constitutional:  Negative for chills and fever.  ?HENT:  Negative for sore throat.   ?Respiratory:  Negative for cough and shortness of breath.   ?Cardiovascular:  Negative for chest pain.  ?Gastrointestinal:  Negative for abdominal pain, anorexia, constipation, diarrhea, nausea and vomiting.  ?Genitourinary:  Positive for frequency and testicular pain. Negative for dysuria, flank pain, hesitancy, pelvic pain, penile discharge, penile pain, scrotal swelling and urgency.  ?Musculoskeletal:  Negative for joint pain.  ?Skin:  Negative for rash.  ?Neurological:  Negative for headaches.  ? ?  Objective  ?  ?BP (!) 144/92 (BP Location: Left Arm, Patient Position: Sitting, Cuff Size: Large)   Pulse 75   Ht 6' (1.829 m)   Wt 236 lb (107 kg)   SpO2 99%   BMI 32.01 kg/m?  ? ? ?Physical Exam ?Vitals and nursing note reviewed.  ?Constitutional:   ?   Appearance: Normal appearance.  ?HENT:  ?   Head: Normocephalic and atraumatic.  ?Cardiovascular:  ?   Rate and Rhythm: Normal rate and regular rhythm.  ?   Pulses: Normal pulses.  ?   Heart sounds: Normal heart sounds.  ?Pulmonary:  ?   Effort: Pulmonary effort is normal.  ?   Breath sounds: Normal breath sounds.  ?Abdominal:  ?   General: Abdomen is flat. Bowel sounds are normal. There is no distension.  ?   Palpations: Abdomen is soft. There is no mass.  ?   Tenderness: There is no abdominal tenderness. There is no right CVA tenderness, left CVA tenderness or guarding.  ?   Hernia: No hernia is present.  ?Skin: ?  General: Skin is warm.  ?   Findings: No lesion or rash.  ?Neurological:  ?   Mental Status: He is alert.  ?Psychiatric:     ?   Behavior: Behavior normal.     ?   Thought Content: Thought content normal.     ?   Judgment: Judgment normal.  ?   Comments: He seems anxious  ?  ? Assessment & Plan  ?  ?1. Possible exposure to STD ?Partner was diagnosed with chlamydia.  HPI positive for urinary frequency increased, presense of genital pruritis and  testicular pain ?- POCT Urinalysis Dipstick was negative today ?- Urine Culture ?- HIV antibody (with reflex) ?- RPR ?- Urinalysis, Routine w reflex microscopic ?- Urine cytology ancillary only  ?  ?Follow-up in a week or 2 ?The patient was advised to call back or seek an in-person evaluation if the symptoms worsen or if the condition fails to improve as anticipated. ? ?I discussed the assessment and treatment plan with the patient. The patient was provided an opportunity to ask questions and all were answered. The patient agreed with the plan and demonstrated an understanding of the instructions. ? ?The entirety of the information documented in the History of Present Illness, Review of Systems and Physical Exam were personally obtained by me. Portions of this information were initially documented by the CMA and reviewed by me for thoroughness and accuracy.   ?[name], PA-C  ?[facility] ?[phone] (phone) ?[phone] (fax) ? ?[facility]  ?

## 2022-02-22 ENCOUNTER — Encounter: Payer: Self-pay | Admitting: Physician Assistant

## 2022-02-22 LAB — URINALYSIS, ROUTINE W REFLEX MICROSCOPIC
Bilirubin, UA: NEGATIVE
Glucose, UA: NEGATIVE
Ketones, UA: NEGATIVE
Leukocytes,UA: NEGATIVE
Nitrite, UA: NEGATIVE
Protein,UA: NEGATIVE
RBC, UA: NEGATIVE
Specific Gravity, UA: 1.005 (ref 1.005–1.030)
Urobilinogen, Ur: 0.2 mg/dL (ref 0.2–1.0)
pH, UA: 7 (ref 5.0–7.5)

## 2022-02-22 LAB — RPR: RPR Ser Ql: NONREACTIVE

## 2022-02-22 LAB — HIV ANTIBODY (ROUTINE TESTING W REFLEX): HIV Screen 4th Generation wRfx: NONREACTIVE

## 2022-02-23 LAB — URINE CYTOLOGY ANCILLARY ONLY
Bacterial Vaginitis-Urine: NEGATIVE
Candida Urine: NEGATIVE
Chlamydia: NEGATIVE
Comment: NEGATIVE
Comment: NEGATIVE
Comment: NORMAL
Neisseria Gonorrhea: NEGATIVE
Trichomonas: NEGATIVE

## 2022-02-23 LAB — POCT URINALYSIS DIPSTICK
Bilirubin, UA: NEGATIVE
Blood, UA: NEGATIVE
Glucose, UA: NEGATIVE
Ketones, UA: NEGATIVE
Nitrite, UA: NEGATIVE
Protein, UA: NEGATIVE
Spec Grav, UA: 1.015 (ref 1.010–1.025)
Urobilinogen, UA: 0.2 E.U./dL
pH, UA: 6 (ref 5.0–8.0)

## 2022-02-23 LAB — URINE CULTURE: Organism ID, Bacteria: NO GROWTH

## 2022-03-07 ENCOUNTER — Other Ambulatory Visit: Payer: Self-pay

## 2022-03-07 ENCOUNTER — Encounter: Payer: Self-pay | Admitting: Physician Assistant

## 2022-03-07 ENCOUNTER — Ambulatory Visit
Admission: RE | Admit: 2022-03-07 | Discharge: 2022-03-07 | Disposition: A | Discharge status: Home / Self Care | Payer: Self-pay | Source: Ambulatory Visit | Attending: Physician Assistant | Admitting: Physician Assistant

## 2022-03-07 ENCOUNTER — Ambulatory Visit
Admission: RE | Admit: 2022-03-07 | Discharge: 2022-03-07 | Disposition: A | Discharge status: Home / Self Care | Payer: Self-pay | Attending: Physician Assistant | Admitting: Physician Assistant

## 2022-03-07 ENCOUNTER — Ambulatory Visit (INDEPENDENT_AMBULATORY_CARE_PROVIDER_SITE_OTHER): Payer: BC Managed Care – PPO | Admitting: Physician Assistant

## 2022-03-07 VITALS — BP 133/87 | HR 79 | Wt 234.0 lb

## 2022-03-07 DIAGNOSIS — S99921A Unspecified injury of right foot, initial encounter: Secondary | ICD-10-CM

## 2022-03-07 DIAGNOSIS — S99911A Unspecified injury of right ankle, initial encounter: Secondary | ICD-10-CM | POA: Diagnosis not present

## 2022-03-07 NOTE — Progress Notes (Signed)
?  ?I,Shelina Luo Robinson,acting as a Education administrator for [employer], PA-C.,have documented all relevant documentation on the behalf of Mardene Speak, PA-C,as directed by  [employer], PA-C while in the presence of [employer], PA-C. ? ? ? ?Established patient visit ? ? ?Patient: Ian Phillips   DOB: 10/01/1996   26 y.o. Male  MRN: [id number] ?Visit Date: 03/07/2022 ? ?Today's healthcare provider: Mardene Speak, PA-C  ? ?Patient present to our clinic with c/o foot injury ? ? ?Subjective  ?  ?HPI  ? ?Pt present for right ankle pain.  Pt reports that he was at wrestling match on Sunday and came down on ankle wrong.  Pt reports swelling afterwards and treated with ice and ibuprofen.  Reports no improvement since. Then had fall in shower yesterday and afterwards ankle was more aggravated.      ? ?Medications: ?No outpatient medications prior to visit.  ? ?No facility-administered medications prior to visit.  ? ? ?Review of Systems  ?Constitutional:  Positive for activity change and appetite change. Negative for fever.  ?Respiratory: Negative.    ?Cardiovascular: Negative.   ?Gastrointestinal: Negative.   ?Musculoskeletal:  Positive for arthralgias, gait problem and joint swelling.  ?Neurological:  Negative for weakness.  ?Psychiatric/Behavioral:  Positive for agitation.   ? ? ?  Objective  ?  ?BP 133/87 (BP Location: Right Arm, Patient Position: Sitting, Cuff Size: Large)   Pulse 79   Wt 234 lb (106.1 kg)   SpO2 97%   BMI 31.74 kg/m?  ? ? ?Physical Exam ?Constitutional:   ?   General: He is in acute distress.  ?   Appearance: Normal appearance.  ?HENT:  ?   Head: Normocephalic and atraumatic.  ?Cardiovascular:  ?   Rate and Rhythm: Regular rhythm. Tachycardia present.  ?   Pulses: Normal pulses.  ?Pulmonary:  ?   Effort: Pulmonary effort is normal.  ?Abdominal:  ?   General: Abdomen is flat.  ?   Palpations: Abdomen is soft.  ?Musculoskeletal:  ?   Comments: ROM restricted due to pain; right foot, right ankle,  achilles tendon: tenderness on palpation and swelling ?Lateral malleolus and medical malleolus tenderness on palpation and swelling  ?Skin: ?   Findings: No bruising (on the dorsal aspect of the right foot).  ?Neurological:  ?   General: No focal deficit present.  ?   Mental Status: He is alert and oriented to person, place, and time.  ?   Gait: Gait abnormal (antalgic).  ?Psychiatric:     ?   Behavior: Behavior normal.     ?   Thought Content: Thought content normal.     ?   Judgment: Judgment normal.  ?  ? ? ? Assessment & Plan  ?  ? ?1. Foot / ankle injury, right, initial encounter ?- DG Foot Complete Right; Future ?- DG Ankle Complete Right; Future  ?- rest , ice, NSAIDs, compression , elevation, crutches for the first two days ?   ?Work note provided. FU in a week. ? ?The patient was advised to call back or seek an in-person evaluation if the symptoms worsen or if the condition fails to improve as anticipated. ? ?I discussed the assessment and treatment plan with the patient. The patient was provided an opportunity to ask questions and all were answered. The patient agreed with the plan and demonstrated an understanding of the instructions. ? ?The entirety of the information documented in the History of Present Illness, Review of Systems and Physical  Exam were personally obtained by me. Portions of this information were initially documented by the CMA and reviewed by me for thoroughness and accuracy.   ? ?I,Fue Cervenka Robinson,acting as a Education administrator for [employer], PA-C.,have documented all relevant documentation on the behalf of Mardene Speak, PA-C,as directed by  [employer], PA-C while in the presence of [employer], PA-C. ? ? ?Mardene Speak, PA-C  ?[facility] ?[phone] (phone) ?[phone] (fax) ? ?[facility] ?

## 2022-03-11 NOTE — Progress Notes (Signed)
? ?Established Patient Office Visit ? ?Subjective:  ?Patient ID: Ian Phillips, male    DOB: 09/18/1996  Age: 26 y.o. MRN: [id number] ? ? ?Chief Complaint  ?Patient presents with  ? Follow-up  ? ? ?HPI ?Finn C Bacha presents for foot/ankle injury follow-up ?  ?Patient reports that swelling subsided but pain is still here. He has been taking up to 800 mg of ibuprofen every 4 hours with some relief. He is back to work. Yesterday he tried a tall post op boot of his colleague. ?He felt that it helped with relieving of his pain. ?Denies having any fever, paresthesias. ? ?Past Medical History:  ?Diagnosis Date  ? Anxiety   ? ? ?No past surgical history on file. ? ?Family History  ?Problem Relation Age of Onset  ? Cancer Mother   ? ? ?Social History  ? ?Socioeconomic History  ? Marital status: Single  ?  Spouse name: Not on file  ? Number of children: Not on file  ? Years of education: Not on file  ? Highest education level: Not on file  ?Occupational History  ? Not on file  ?Tobacco Use  ? Smoking status: Never  ? Smokeless tobacco: Never  ?Vaping Use  ? Vaping Use: Never used  ?Substance and Sexual Activity  ? Alcohol use: No  ? Drug use: No  ? Sexual activity: Not on file  ?Other Topics Concern  ? Not on file  ?Social History Narrative  ? Not on file  ? ?Social Determinants of Health  ? ?Financial Resource Strain: Not on file  ?Food Insecurity: Not on file  ?Transportation Needs: Not on file  ?Physical Activity: Not on file  ?Stress: Not on file  ?Social Connections: Not on file  ?Intimate Partner Violence: Not on file  ? ? ?Outpatient Medications Prior to Visit  ?Medication Sig Dispense Refill  ? acetaminophen (TYLENOL) 500 MG tablet Take 500 mg by mouth every 6 (six) hours as needed.    ? ibuprofen (ADVIL) 200 MG tablet Take 200 mg by mouth every 6 (six) hours as needed.    ? ?No facility-administered medications prior to visit.  ? ? ?No Known Allergies ? ?ROS ?Review of Systems  ?Constitutional:   Positive for activity change and appetite change. Negative for chills and fever.  ?Musculoskeletal:  Positive for arthralgias and gait problem.  ? ?  ?Objective:  ?  ?Physical Exam ?Vitals and nursing note reviewed.  ?Constitutional:   ?   Appearance: Normal appearance.  ?HENT:  ?   Head: Normocephalic and atraumatic.  ?Cardiovascular:  ?   Pulses: Normal pulses.  ?Pulmonary:  ?   Effort: Pulmonary effort is normal.  ?Musculoskeletal:     ?   General: Tenderness (over the dorsal aspect of the right midfoot and achilles tendon) present.  ?Neurological:  ?   General: No focal deficit present.  ?   Mental Status: He is alert and oriented to person, place, and time.  ?Psychiatric:     ?   Behavior: Behavior normal.     ?   Thought Content: Thought content normal.     ?   Judgment: Judgment normal.  ? ? ?BP 139/90 (BP Location: Right Arm, Patient Position: Sitting, Cuff Size: Large)   Pulse 91   Temp 98.3 ?F (36.8 ?C) (Oral)   Resp 16   Ht 6' (1.829 m)   Wt 239 lb (108.4 kg)   SpO2 96%   BMI 32.41 kg/m?  ?Wt Readings from  Last 3 Encounters:  ?03/15/22 239 lb (108.4 kg)  ?03/07/22 234 lb (106.1 kg)  ?02/21/22 236 lb (107 kg)  ? ? ? ?Health Maintenance Due  ?Topic Date Due  ? COVID-19 Vaccine (1) Never done  ? HPV VACCINES (1 - Male 2-dose series) Never done  ? Hepatitis C Screening  Never done  ? INFLUENZA VACCINE  07/17/2021  ? ? ?   ?Topic Date Due  ? HPV VACCINES (1 - Male 2-dose series) Never done  ? ? ?Lab Results  ?Component Value Date  ? TSH 1.700 10/01/2018  ? ?Lab Results  ?Component Value Date  ? WBC 6.5 11/09/2019  ? HGB 14.0 11/09/2019  ? HCT 43.3 11/09/2019  ? MCV 88.5 11/09/2019  ? PLT 242 11/09/2019  ? ?Lab Results  ?Component Value Date  ? NA 141 11/09/2019  ? K 4.2 11/09/2019  ? CO2 25 11/09/2019  ? GLUCOSE 93 11/09/2019  ? BUN 13 11/09/2019  ? CREATININE 0.89 11/09/2019  ? BILITOT 0.7 11/09/2019  ? ALKPHOS 88 11/09/2019  ? AST 18 11/09/2019  ? ALT 18 11/09/2019  ? PROT 7.4 11/09/2019  ? ALBUMIN  4.4 11/09/2019  ? CALCIUM 9.3 11/09/2019  ? ANIONGAP 9 11/09/2019  ? ?Lab Results  ?Component Value Date  ? CHOL 189 10/01/2018  ? ?Lab Results  ?Component Value Date  ? HDL 45 10/01/2018  ? ?Lab Results  ?Component Value Date  ? LDLCALC 127 (H) 10/01/2018  ? ?Lab Results  ?Component Value Date  ? TRIG 84 10/01/2018  ? ?Lab Results  ?Component Value Date  ? CHOLHDL 4.2 10/01/2018  ? ?No results found for: HGBA1C ? ?  ?Assessment & Plan:  ? ?Problem List Items Addressed This Visit   ?None ?Visit Diagnoses   ? ? Foot injury, right, initial encounter    -  Primary  ? Relevant Medications  ? meloxicam (MOBIC) 15 MG tablet  ? Ankle injury, right, initial encounter      ? ?  ? ?1. Foot injury, right, initial encounter ?- meloxicam (MOBIC) 15 MG tablet; Take 1 tablet (15 mg total) by mouth daily.  Dispense: 30 tablet; Refill: 0 if patient prefers ?- Xray WNL ? ?2. Ankle injury, right, initial encounter ?-Xray WNL ?Rx for tall post op boot provided. ?- Referred to [facility] for further evaluation ?- Advised rest, ice, NSAIDs injection if needed. ? ?Meds ordered this encounter  ?Medications  ? meloxicam (MOBIC) 15 MG tablet  ?  Sig: Take 1 tablet (15 mg total) by mouth daily.  ?  Dispense:  30 tablet  ?  Refill:  0  ?  Order Specific Question:   Supervising Provider  ?  Answer:   Malva Limes [[id number]]  ?  ? ?The patient was advised to call back or seek an in-person evaluation if the symptoms worsen or if the condition fails to improve as anticipated. ? ?I discussed the assessment and treatment plan with the patient. The patient was provided an opportunity to ask questions and all were answered. The patient agreed with the plan and demonstrated an understanding of the instructions. ? ?The entirety of the information documented in the History of Present Illness, Review of Systems and Physical Exam were personally obtained by me. Portions of this information were initially documented by the CMA and reviewed by me for  thoroughness and accuracy.   ? ?[name], PA-C ?

## 2022-03-12 ENCOUNTER — Telehealth: Payer: Self-pay

## 2022-03-12 NOTE — Progress Notes (Signed)
Hello Temple C Riess ,  ? ?Your imaging results all are within normal limits. Please, continue with the management we discussed during your visit. ?No changes need to be made to medications, and no further tests need to be ordered. ? ?Any questions please reach out to the office or message me on MyChart! ? ?Best, ? ?[name], PA-C

## 2022-03-12 NOTE — Telephone Encounter (Signed)
Copied from CRM [id number]. Topic: Appointment Scheduling - Scheduling Inquiry for Clinic ?>> Mar 12, 2022  4:58 PM Pawlus, Ian Phillips wrote: ?Reason for CRM: Pt stated his appointment was supposed to be on 3/29, was advised that according to the schedule his appt is on 3/30, pt wanted Phillips call back, please advise. ?

## 2022-03-12 NOTE — Telephone Encounter (Signed)
Copied from CRM [id number]. Topic: General - Other ?>> Mar 12, 2022  5:00 PM Pawlus, Ian Phillips wrote: ?Pt stated he still has not been contacting regarding this, please advise.  ?

## 2022-03-13 NOTE — Telephone Encounter (Signed)
Attempted to reach pt. VM full.  ?

## 2022-03-15 ENCOUNTER — Encounter: Payer: Self-pay | Admitting: Physician Assistant

## 2022-03-15 ENCOUNTER — Ambulatory Visit (INDEPENDENT_AMBULATORY_CARE_PROVIDER_SITE_OTHER): Payer: BC Managed Care – PPO | Admitting: Physician Assistant

## 2022-03-15 VITALS — BP 139/90 | HR 91 | Temp 98.3°F | Resp 16 | Ht 72.0 in | Wt 239.0 lb

## 2022-03-15 DIAGNOSIS — S99921A Unspecified injury of right foot, initial encounter: Secondary | ICD-10-CM

## 2022-03-15 DIAGNOSIS — S99911A Unspecified injury of right ankle, initial encounter: Secondary | ICD-10-CM

## 2022-03-15 MED ORDER — MELOXICAM 15 MG PO TABS: 15.0000 mg | ORAL_TABLET | Freq: Every day | ORAL | 0 refills | Status: AC

## 2022-03-16 ENCOUNTER — Ambulatory Visit: Payer: BC Managed Care – PPO | Admitting: Physician Assistant

## 2022-03-18 DIAGNOSIS — S99921A Unspecified injury of right foot, initial encounter: Secondary | ICD-10-CM | POA: Insufficient documentation

## 2022-03-18 DIAGNOSIS — S99911A Unspecified injury of right ankle, initial encounter: Secondary | ICD-10-CM | POA: Insufficient documentation

## 2022-05-10 ENCOUNTER — Ambulatory Visit: Payer: BC Managed Care – PPO | Admitting: Physician Assistant

## 2022-05-10 NOTE — Progress Notes (Unsigned)
     I,Ian Phillips,acting as a Neurosurgeon for [employer], PA-C.,have documented all relevant documentation on the behalf of Ian Lat, PA-C,as directed by  [employer], PA-C while in the presence of [employer], PA-C.   Established patient visit   Patient: Ian Phillips   DOB: Nov 14, 1996   26 y.o. Male  MRN: [id number] Visit Date: 05/10/2022  Today's healthcare provider: [name], PA-C   No chief complaint on file. CC:  Subjective    Exposure to STD     Medications: Outpatient Medications Prior to Visit  Medication Sig   acetaminophen (TYLENOL) 500 MG tablet Take 500 mg by mouth every 6 (six) hours as needed.   ibuprofen (ADVIL) 200 MG tablet Take 200 mg by mouth every 6 (six) hours as needed.   meloxicam (MOBIC) 15 MG tablet Take 1 tablet (15 mg total) by mouth daily.   No facility-administered medications prior to visit.    Review of Systems  {Labs  Heme  Chem  Endocrine  Serology  Results Review (optional):23779}   Objective    There were no vitals taken for this visit. {Show previous vital signs (optional):23777}  Physical Exam  ***  No results found for any visits on 05/10/22.  Assessment & Plan     ***  No follow-ups on file.      {provider attestation***:1}   Ian Phillips, Cordelia Poche  [facility] [phone] (phone) [phone] (fax)  [facility]

## 2024-06-26 ENCOUNTER — Other Ambulatory Visit: Payer: Self-pay

## 2024-06-26 ENCOUNTER — Emergency Department: Payer: Self-pay

## 2024-06-26 ENCOUNTER — Emergency Department
Admission: EM | Admit: 2024-06-26 | Discharge: 2024-06-27 | Disposition: A | Discharge status: Home / Self Care | Payer: Self-pay

## 2024-06-26 DIAGNOSIS — M542 Cervicalgia: Secondary | ICD-10-CM | POA: Insufficient documentation

## 2024-06-26 MED ORDER — LORAZEPAM 1 MG PO TABS
1.0000 mg | ORAL_TABLET | Freq: Once | ORAL | Status: AC
  Administered 2024-06-26: 1 mg via ORAL
  Filled 2024-06-26: qty 1

## 2024-06-26 MED ORDER — IBUPROFEN 800 MG PO TABS
800.0000 mg | ORAL_TABLET | Freq: Once | ORAL | Status: AC
  Administered 2024-06-26: 800 mg via ORAL
  Filled 2024-06-26: qty 1

## 2024-06-26 NOTE — ED Provider Notes (Incomplete)
 [facility] Provider Note    Event Date/Time   First MD Initiated Contact with Patient 06/26/24 2328     (approximate)   History   Neck Pain   HPI  Ian Phillips is a 28 y.o. male with PMH of anxiety who presents for evaluation of neck pain that began 3 6 ago.  Patient participates in wrestle mania and while practicing for a stunt, he will Suplexa onto his head.  He landed on the head and felt a crunch in his neck.  He has had ongoing pain since this incident.  He reports some numbness and tingling in the hands that wakes him up at night.  He has also had decreased strength on the left side.       Physical Exam   Triage Vital Signs: ED Triage Vitals  Encounter Vitals Group     BP 06/26/24 1815 (!) 163/119     Girls Systolic BP Percentile --      Girls Diastolic BP Percentile --      Boys Systolic BP Percentile --      Boys Diastolic BP Percentile --      Pulse Rate 06/26/24 1815 70     Resp 06/26/24 1815 18     Temp 06/26/24 1815 98 F (36.7 C)     Temp src --      SpO2 06/26/24 1815 100 %     Weight 06/26/24 1813 240 lb (108.9 kg)     Height 06/26/24 1813 5' 10 (1.778 m)     Head Circumference --      Peak Flow --      Pain Score 06/26/24 1813 7     Pain Loc --      Pain Education --      Exclude from Growth Chart --     Most recent vital signs: Vitals:   06/26/24 1815  BP: (!) 163/119  Pulse: 70  Resp: 18  Temp: 98 F (36.7 C)  SpO2: 100%   General: Awake, no distress.  CV:  Good peripheral perfusion.  RRR. Resp:  Normal effort.  TAB Abd:  No distention.  Other:  .  Tender to palpation over the cervical vertebrae and paraspinal muscles, worse on the left side, neck extension, left rotation and left lateral flexion all limited due to pain, decreased grip strength on the left side, decree sensation in the left pinky when compared to the other side, radial pulses are equal bilaterally, patient can make the okay sign, can  cross middle finger over index finger and can give a thumbs up with both hands.   ED Results / Procedures / Treatments   Labs (all labs ordered are listed, but only abnormal results are displayed) Labs Reviewed - No data to display   RADIOLOGY  CT cervical spine as well as MRI cervical spine obtained.  CT was negative for acute abnormalities, MRI does not show acute cord compression there are minor degenerative spondylosis at C3-4 through C6-7 without stenosis or neural impingement.  PROCEDURES:  Critical Care performed: No  Procedures   MEDICATIONS ORDERED IN ED: Medications  LORazepam  (ATIVAN ) tablet 1 mg (1 mg Oral Given 06/26/24 2315)  ibuprofen  (ADVIL ) tablet 800 mg (800 mg Oral Given 06/26/24 2316)     IMPRESSION / MDM / ASSESSMENT AND PLAN / ED COURSE  I reviewed the triage vital signs and the nursing notes.  28 year old male presents for evaluation of neck pain that began 3 weeks ago.  Blood pressure is elevated otherwise vital signs are stable.  Patient somewhat uncomfortable on exam.  Differential diagnosis includes, but is not limited to, neck fracture, neck strain, brachial plexus injury, spinal cord compression.  Patient's presentation is most consistent with acute complicated illness / injury requiring diagnostic workup.  CT cervical spine was negative for acute abnormalities.  On exam patient does have decreased grip strength and sensation on the left side.  Consulted neurosurgery to determine if he needed to have MRI now or if this could be done as an outpatient.  Dr. Nelida recommended MRI while in the emergency department.  MRI did not show signs of acute cord compression but did show spondylosis throughout the cervical spine without stenosis or neural impingement.  Will have patient follow-up with neurosurgery as an outpatient.  He can continue to take Tylenol  and ibuprofen  as needed.  Patient voiced understanding, all questions were  answered he is stable at discharge.      FINAL CLINICAL IMPRESSION(S) / ED DIAGNOSES   Final diagnoses:  None     Rx / DC Orders   ED Discharge Orders     None        Note:  This document was prepared using Dragon voice recognition software and may include unintentional dictation errors.

## 2024-06-26 NOTE — ED Notes (Signed)
 Pt states he was doing a wrestling move when he was thrown over someone's shoulder and hit his head. Pt denies any LOC BUT, states since the incident he has been having pain to his neck and shoulder on the left side. States it hurt to turn his head to the left and also reports numbness to both of his hands.

## 2024-06-26 NOTE — ED Provider Notes (Signed)
 [facility] Provider Note    Event Date/Time   First MD Initiated Contact with Patient 06/26/24 2328     (approximate)   History   Neck Pain   HPI  Ian Phillips is a 28 y.o. male with PMH of anxiety who presents for evaluation of neck pain that began 3 6 ago.  Patient participates in wrestle mania and while practicing for a stunt, he will Suplexa onto his head.  He landed on the head and felt a crunch in his neck.  He has had ongoing pain since this incident.  He reports some numbness and tingling in the hands that wakes him up at night.  He has also had decreased strength on the left side.       Physical Exam   Triage Vital Signs: ED Triage Vitals  Encounter Vitals Group     BP 06/26/24 1815 (!) 163/119     Girls Systolic BP Percentile --      Girls Diastolic BP Percentile --      Boys Systolic BP Percentile --      Boys Diastolic BP Percentile --      Pulse Rate 06/26/24 1815 70     Resp 06/26/24 1815 18     Temp 06/26/24 1815 98 F (36.7 C)     Temp src --      SpO2 06/26/24 1815 100 %     Weight 06/26/24 1813 240 lb (108.9 kg)     Height 06/26/24 1813 5' 10 (1.778 m)     Head Circumference --      Peak Flow --      Pain Score 06/26/24 1813 7     Pain Loc --      Pain Education --      Exclude from Growth Chart --     Most recent vital signs: Vitals:   06/26/24 1815  BP: (!) 163/119  Pulse: 70  Resp: 18  Temp: 98 F (36.7 C)  SpO2: 100%   General: Awake, no distress.  CV:  Good peripheral perfusion.  RRR. Resp:  Normal effort.  TAB Abd:  No distention.  Other:  .  Tender to palpation over the cervical vertebrae and paraspinal muscles, worse on the left side, neck extension, left rotation and left lateral flexion all limited due to pain, decreased grip strength on the left side, decree sensation in the left pinky when compared to the other side, radial pulses are equal bilaterally, patient can make the okay sign, can  cross middle finger over index finger and can give a thumbs up with both hands.   ED Results / Procedures / Treatments   Labs (all labs ordered are listed, but only abnormal results are displayed) Labs Reviewed - No data to display   RADIOLOGY  CT cervical spine as well as MRI cervical spine obtained.  CT was negative for acute abnormalities, MRI does not show acute cord compression there are minor degenerative spondylosis at C3-4 through C6-7 without stenosis or neural impingement.  PROCEDURES:  Critical Care performed: No  Procedures   MEDICATIONS ORDERED IN ED: Medications  methocarbamol  (ROBAXIN ) tablet 1,000 mg (has no administration in time range)  LORazepam  (ATIVAN ) tablet 1 mg (1 mg Oral Given 06/26/24 2315)  ibuprofen  (ADVIL ) tablet 800 mg (800 mg Oral Given 06/26/24 2316)     IMPRESSION / MDM / ASSESSMENT AND PLAN / ED COURSE  I reviewed the triage vital signs and the nursing notes.  28 year old male presents for evaluation of neck pain that began 3 weeks ago.  Blood pressure is elevated otherwise vital signs are stable.  Patient somewhat uncomfortable on exam.  Differential diagnosis includes, but is not limited to, neck fracture, neck strain, brachial plexus injury, spinal cord compression.  Patient's presentation is most consistent with acute complicated illness / injury requiring diagnostic workup.  CT cervical spine was negative for acute abnormalities.  On exam patient does have decreased grip strength and sensation on the left side.  Consulted neurosurgery to determine if he needed to have MRI now or if this could be done as an outpatient.  Dr. Deatrice recommended MRI while in the emergency department.  MRI did not show signs of acute cord compression but did show spondylosis throughout the cervical spine without stenosis or neural impingement.  Will have patient follow-up with neurosurgery as an outpatient.  He can continue to take  Tylenol  and ibuprofen  as needed. Will prescribe a muscle relaxer and patient given a dose before discharge. Patient voiced understanding, all questions were answered he is stable at discharge.      FINAL CLINICAL IMPRESSION(S) / ED DIAGNOSES   Final diagnoses:  Neck pain     Rx / DC Orders   ED Discharge Orders          Ordered    methocarbamol  (ROBAXIN ) 500 MG tablet  3 times daily        06/27/24 0005             Note:  This document was prepared using Dragon voice recognition software and may include unintentional dictation errors.   Cleaster Tinnie LABOR, PA-C 06/27/24 0006    Clarine Ozell LABOR, MD 07/01/24 [phone]

## 2024-06-26 NOTE — ED Triage Notes (Signed)
 Pt comes with neck pain that started 3 weeks ago. Pt stats he was wrestling and injured it .

## 2024-06-27 MED ORDER — METHOCARBAMOL 500 MG PO TABS: 1000.0000 mg | ORAL_TABLET | Freq: Three times a day (TID) | ORAL | 0 refills | Status: AC

## 2024-06-27 MED ORDER — METHOCARBAMOL 500 MG PO TABS
1000.0000 mg | ORAL_TABLET | Freq: Once | ORAL | Status: AC
  Administered 2024-06-27: 1000 mg via ORAL
  Filled 2024-06-27: qty 2

## 2024-06-27 NOTE — Discharge Instructions (Signed)
 Please schedule follow-up appointment with neurosurgery.  Their information is attached.  You can take 650 mg of Tylenol  and 600 mg of ibuprofen  every 6 hours as needed for pain. You can use ice, heat, muscle creams and other topical pain relievers as well.  I have sent a muscle relaxer to your pharmacy. This can be taken every 8 hours as needed for muscle spasms. This medication is sedating, so do not drive for 8 hours after taking it.

## 2024-06-27 NOTE — Progress Notes (Signed)
 Neurosurgery brief note  Have been contacted by the emergency room regarding this patient.  He apparently is a 28 year old male with past medical history of anxiety who had been participating in some sort of wrestling practicing for a stunt when he landed on his head and felt a crunch he had ongoing pain since that incident reported subjectively some numbness and tingling in his hands that wakes him up at night and decreased doing in his left hand. Per the emergency room's examination he apparently had some tenderness over the cervical vertebrae in the paraspinal muscles worse on the left-hand side particularly with neck extension he had some decreased grip strength to his left hand subjectively and decree sensation to his left pinky when compared to the other side otherwise minor stenoses neurologically intact  I asked them to obtain both a CT scan of the cervical spine as well as an MRI of the cervical spine.   IMPRESSION: No acute fracture or evidence of traumatic listhesis in the cervical spine.     Electronically Signed   By: Norman Gatlin M.D.   On: 06/26/2024 18:58  IMPRESSION: 1. No MRI evidence for acute traumatic injury within the cervical spine. 2. Minor degenerative spondylosis at C3-4 through C6-7 without stenosis or neural impingement.     Electronically Signed   By: Morene Hoard M.D.   On: 06/26/2024 23:54  AP: Overall the imaging was very reassuring the patient does have some degenerative changes but no obvious traumatic injury that would explain his symptoms.  Patient was discharged from the emergency room by the emergency room team with plans for neurosurgery follow-up and we can arrange for that follow-up.  Belvie PARAS. Deatrice, MD Neurosurgery

## 2024-06-29 ENCOUNTER — Telehealth: Payer: Self-pay

## 2024-06-29 NOTE — Telephone Encounter (Signed)
 This patient was in the ER on 06/26/24 for neck pain. The ER called Dr Deatrice while he was covering call for our department. Per Dr Deatrice, he will need an appointment with our clinic.  Please offer a routine new patient appointment with any PA.

## 2024-07-08 NOTE — Progress Notes (Deleted)
 Referring Physician:  Ostwalt, Janna, PA-C [address] [place],  [place] [address]  Primary Physician:  Ostwalt, Janna, PA-C  History of Present Illness: 07/08/2024*** Mr. Ian Phillips has a history of anxiety.   Seen in ED on 06/26/24 for neck pain that started a few days prior when he was practicing wrestling moves (he participates in wrestle mania).   Neck pain with numbness and tingling in his hands-left side worse than right?   Given robaxin  from ED.   Duration: *** Location: *** Quality: *** Severity: ***  Precipitating: aggravated by *** Modifying factors: made better by *** Weakness: none Timing: ***  Tobacco use: Does not smoke.   Bowel/Bladder Dysfunction: none  Conservative measures:  Physical therapy: *** has not participated in Multimodal medical therapy including regular antiinflammatories: *** Tylenol /ibuprofen , Robaxin  Injections: *** no epidural steroid injections  Past Surgery: ***no spine or neck surgery  Ian Phillips has ***no symptoms of cervical myelopathy.  The symptoms are causing a significant impact on the patient's life.   Review of Systems:  A 10 point review of systems is negative, except for the pertinent positives and negatives detailed in the HPI.  Past Medical History: Past Medical History:  Diagnosis Date   Anxiety     Past Surgical History: No past surgical history on file.  Allergies: Allergies as of 07/14/2024   (No Known Allergies)    Medications: Outpatient Encounter Medications as of 07/14/2024  Medication Sig   acetaminophen  (TYLENOL ) 500 MG tablet Take 500 mg by mouth every 6 (six) hours as needed.   ibuprofen  (ADVIL ) 200 MG tablet Take 200 mg by mouth every 6 (six) hours as needed.   meloxicam  (MOBIC ) 15 MG tablet Take 1 tablet (15 mg total) by mouth daily.   No facility-administered encounter medications on file as of 07/14/2024.    Social History: Social History   Tobacco Use    Smoking status: Never   Smokeless tobacco: Never  Vaping Use   Vaping status: Never Used  Substance Use Topics   Alcohol use: No   Drug use: No    Family Medical History: Family History  Problem Relation Age of Onset   Cancer Mother     Physical Examination: There were no vitals filed for this visit.  General: Patient is well developed, well nourished, calm, collected, and in no apparent distress. Attention to examination is appropriate.  Respiratory: Patient is breathing without any difficulty.   NEUROLOGICAL:     Awake, alert, oriented to person, place, and time.  Speech is clear and fluent. Fund of knowledge is appropriate.   Cranial Nerves: Pupils equal round and reactive to light.  Facial tone is symmetric.    *** ROM of cervical spine *** pain *** posterior cervical tenderness. *** tenderness in bilateral trapezial region.   *** ROM of lumbar spine *** pain *** posterior lumbar tenderness.   No abnormal lesions on exposed skin.   Strength: Side Biceps Triceps Deltoid Interossei Grip Wrist Ext. Wrist Flex.  R 5 5 5 5 5 5 5   L 5 5 5 5 5 5 5    Side Iliopsoas Quads Hamstring PF DF EHL  R 5 5 5 5 5 5   L 5 5 5 5 5 5    Reflexes are ***2+ and symmetric at the biceps, brachioradialis, patella and achilles.   Hoffman's is absent.  Clonus is not present.   Bilateral upper and lower extremity sensation is intact to light touch.     Gait  is normal.   ***No difficulty with tandem gait.    Medical Decision Making  Imaging: MRI cervical spine dated 06/26/24:  FINDINGS: Alignment: Straightening with mild reversal of the normal cervical lordosis. No significant listhesis.   Vertebrae: Vertebral body height maintained without acute or chronic fracture. Diffuse loss of normal bone marrow signal, nonspecific but can be seen with anemia, smoking, obesity, and infiltrative/myelofibrotic marrow processes. No worrisome osseous lesions or abnormal marrow edema.   Cord:  Normal signal and morphology.   Posterior Fossa, vertebral arteries, paraspinal tissues: Unremarkable.   Disc levels:   C2-C3: Unremarkable.   C3-C4: Disc desiccation without significant disc bulge. No canal or foraminal stenosis.   C4-C5: Disc desiccation without disc bulge. No canal or foraminal stenosis.   C5-C6: Disc desiccation with minimal annular disc bulge. No spinal stenosis. Foramina remain patent.   C6-C7: Disc desiccation with minimal annular disc bulge. No spinal stenosis. Foramina remain patent.   C7-T1: Mild disc desiccation without significant disc bulge. Mild right greater left facet hypertrophy. No canal or foraminal stenosis.   IMPRESSION: 1. No MRI evidence for acute traumatic injury within the cervical spine. 2. Minor degenerative spondylosis at C3-4 through C6-7 without stenosis or neural impingement.     Electronically Signed   By: Morene Hoard M.D.   On: 06/26/2024 23:54   CT of cervical spine dated 06/26/24:  FINDINGS: Alignment: Straightening lordosis is likely chronic/positional. No evidence traumatic listhesis.   Skull base and vertebrae: No acute fracture.   Soft tissues and spinal canal: No prevertebral fluid or swelling. No visible canal hematoma.   Disc levels: No significant disc space height loss. No spinal narrowing. No significant neural foraminal narrowing.   Upper chest: No acute abnormality.   Other: None.   IMPRESSION: No acute fracture or evidence of traumatic listhesis in the cervical spine.     Electronically Signed   By: Norman Gatlin M.D.   On: 06/26/2024 18:58    I have personally reviewed the images and agree with the above interpretation.  Assessment and Plan: Ian Phillips is a pleasant 28 y.o. male has ***  Treatment options discussed with patient and following plan made:   - Order for physical therapy for *** spine ***. Patient to call to schedule appointment. *** - Continue current  medications including ***. Reviewed dosing and side effects.  - Prescription for ***. Reviewed dosing and side effects. Take with food.  - Prescription for *** to take prn muscle spasms. Reviewed dosing and side effects. Discussed this can cause drowsiness.  - MRI of *** to further evaluate *** radiculopathy. No improvement time or medications (***).  - Referral to PMR at [facility] to discuss possible *** injections.  - Will schedule phone visit to review MRI results once I get them back.   I spent a total of *** minutes in face-to-face and non-face-to-face activities related to this patient's care today including review of outside records, review of imaging, review of symptoms, physical exam, discussion of differential diagnosis, discussion of treatment options, and documentation.   Thank you for involving me in the care of this patient.   Glade Boys PA-C Dept. of Neurosurgery

## 2024-07-14 ENCOUNTER — Ambulatory Visit: Payer: Self-pay | Admitting: Orthopedic Surgery

## 2024-07-20 NOTE — Progress Notes (Deleted)
 Referring Physician:  Ostwalt, Janna, PA-C [address] [place],  [place] [address]  Primary Physician:  Ostwalt, Janna, PA-C  History of Present Illness: 07/20/2024*** Ian Phillips has a history of anxiety.   Seen in ED on 06/26/24 for neck pain that started a few days prior when he was practicing wrestling moves (he participates in wrestle mania).   Neck pain with numbness and tingling in his hands-left side worse than right?   Given robaxin  from ED.   Duration: *** Location: *** Quality: *** Severity: ***  Precipitating: aggravated by *** Modifying factors: made better by *** Weakness: none Timing: ***  Tobacco use: Does not smoke.   Bowel/Bladder Dysfunction: none  Conservative measures:  Physical therapy: *** has not participated in Multimodal medical therapy including regular antiinflammatories: *** Tylenol /ibuprofen , Robaxin  Injections: *** no epidural steroid injections  Past Surgery: ***no spine or neck surgery  Ian Phillips has ***no symptoms of cervical myelopathy.  The symptoms are causing a significant impact on the patient's life.   Review of Systems:  A 10 point review of systems is negative, except for the pertinent positives and negatives detailed in the HPI.  Past Medical History: Past Medical History:  Diagnosis Date   Anxiety     Past Surgical History: No past surgical history on file.  Allergies: Allergies as of 08/03/2024   (No Known Allergies)    Medications: Outpatient Encounter Medications as of 08/03/2024  Medication Sig   acetaminophen  (TYLENOL ) 500 MG tablet Take 500 mg by mouth every 6 (six) hours as needed.   ibuprofen  (ADVIL ) 200 MG tablet Take 200 mg by mouth every 6 (six) hours as needed.   meloxicam  (MOBIC ) 15 MG tablet Take 1 tablet (15 mg total) by mouth daily.   No facility-administered encounter medications on file as of 08/03/2024.    Social History: Social History   Tobacco Use    Smoking status: Never   Smokeless tobacco: Never  Vaping Use   Vaping status: Never Used  Substance Use Topics   Alcohol use: No   Drug use: No    Family Medical History: Family History  Problem Relation Age of Onset   Cancer Mother     Physical Examination: There were no vitals filed for this visit.  General: Patient is well developed, well nourished, calm, collected, and in no apparent distress. Attention to examination is appropriate.  Respiratory: Patient is breathing without any difficulty.   NEUROLOGICAL:     Awake, alert, oriented to person, place, and time.  Speech is clear and fluent. Fund of knowledge is appropriate.   Cranial Nerves: Pupils equal round and reactive to light.  Facial tone is symmetric.    *** ROM of cervical spine *** pain *** posterior cervical tenderness. *** tenderness in bilateral trapezial region.   *** ROM of lumbar spine *** pain *** posterior lumbar tenderness.   No abnormal lesions on exposed skin.   Strength: Side Biceps Triceps Deltoid Interossei Grip Wrist Ext. Wrist Flex.  R 5 5 5 5 5 5 5   L 5 5 5 5 5 5 5    Side Iliopsoas Quads Hamstring PF DF EHL  R 5 5 5 5 5 5   L 5 5 5 5 5 5    Reflexes are ***2+ and symmetric at the biceps, brachioradialis, patella and achilles.   Hoffman's is absent.  Clonus is not present.   Bilateral upper and lower extremity sensation is intact to light touch.     Gait  is normal.   ***No difficulty with tandem gait.    Medical Decision Making  Imaging: MRI cervical spine dated 06/26/24:  FINDINGS: Alignment: Straightening with mild reversal of the normal cervical lordosis. No significant listhesis.   Vertebrae: Vertebral body height maintained without acute or chronic fracture. Diffuse loss of normal bone marrow signal, nonspecific but can be seen with anemia, smoking, obesity, and infiltrative/myelofibrotic marrow processes. No worrisome osseous lesions or abnormal marrow edema.   Cord:  Normal signal and morphology.   Posterior Fossa, vertebral arteries, paraspinal tissues: Unremarkable.   Disc levels:   C2-C3: Unremarkable.   C3-C4: Disc desiccation without significant disc bulge. No canal or foraminal stenosis.   C4-C5: Disc desiccation without disc bulge. No canal or foraminal stenosis.   C5-C6: Disc desiccation with minimal annular disc bulge. No spinal stenosis. Foramina remain patent.   C6-C7: Disc desiccation with minimal annular disc bulge. No spinal stenosis. Foramina remain patent.   C7-T1: Mild disc desiccation without significant disc bulge. Mild right greater left facet hypertrophy. No canal or foraminal stenosis.   IMPRESSION: 1. No MRI evidence for acute traumatic injury within the cervical spine. 2. Minor degenerative spondylosis at C3-4 through C6-7 without stenosis or neural impingement.     Electronically Signed   By: Morene Hoard M.D.   On: 06/26/2024 23:54   CT of cervical spine dated 06/26/24:  FINDINGS: Alignment: Straightening lordosis is likely chronic/positional. No evidence traumatic listhesis.   Skull base and vertebrae: No acute fracture.   Soft tissues and spinal canal: No prevertebral fluid or swelling. No visible canal hematoma.   Disc levels: No significant disc space height loss. No spinal narrowing. No significant neural foraminal narrowing.   Upper chest: No acute abnormality.   Other: None.   IMPRESSION: No acute fracture or evidence of traumatic listhesis in the cervical spine.     Electronically Signed   By: Norman Gatlin M.D.   On: 06/26/2024 18:58    I have personally reviewed the images and agree with the above interpretation.  Assessment and Plan: Ian Phillips is a pleasant 28 y.o. male has ***  Treatment options discussed with patient and following plan made:   - Order for physical therapy for *** spine ***. Patient to call to schedule appointment. *** - Continue current  medications including ***. Reviewed dosing and side effects.  - Prescription for ***. Reviewed dosing and side effects. Take with food.  - Prescription for *** to take prn muscle spasms. Reviewed dosing and side effects. Discussed this can cause drowsiness.  - MRI of *** to further evaluate *** radiculopathy. No improvement time or medications (***).  - Referral to PMR at [facility] to discuss possible *** injections.  - Will schedule phone visit to review MRI results once I get them back.   I spent a total of *** minutes in face-to-face and non-face-to-face activities related to this patient's care today including review of outside records, review of imaging, review of symptoms, physical exam, discussion of differential diagnosis, discussion of treatment options, and documentation.   Thank you for involving me in the care of this patient.   Glade Boys PA-C Dept. of Neurosurgery

## 2024-08-03 ENCOUNTER — Ambulatory Visit: Admitting: Orthopedic Surgery

## 2024-08-20 ENCOUNTER — Encounter: Payer: Self-pay | Admitting: Orthopedic Surgery
